# Patient Record
Sex: Male | Born: 1960
Health system: Southern US, Community
[De-identification: ages and names within clinical notes are randomized; demographics above are authoritative.]

## PROBLEM LIST (undated history)

## (undated) ENCOUNTER — Emergency Department (HOSPITAL_COMMUNITY): Discharge status: Absent without leave | Payer: 59

## (undated) DIAGNOSIS — E7800 Pure hypercholesterolemia, unspecified: Secondary | ICD-10-CM

## (undated) DIAGNOSIS — C801 Malignant (primary) neoplasm, unspecified: Secondary | ICD-10-CM

## (undated) DIAGNOSIS — E119 Type 2 diabetes mellitus without complications: Secondary | ICD-10-CM

## (undated) HISTORY — PX: PROSTATE BIOPSY: SHX241

## (undated) HISTORY — DX: Pure hypercholesterolemia, unspecified: E78.00

## (undated) HISTORY — DX: Type 2 diabetes mellitus without complications: E11.9

---

## 2016-02-15 ENCOUNTER — Ambulatory Visit (INDEPENDENT_AMBULATORY_CARE_PROVIDER_SITE_OTHER): Payer: PRIVATE HEALTH INSURANCE | Admitting: Neurology

## 2016-02-15 ENCOUNTER — Encounter: Payer: Self-pay | Admitting: Neurology

## 2016-02-15 VITALS — BP 124/70 | HR 51 | Ht 72.0 in | Wt 176.0 lb

## 2016-02-15 DIAGNOSIS — R202 Paresthesia of skin: Secondary | ICD-10-CM | POA: Diagnosis not present

## 2016-02-15 NOTE — Progress Notes (Signed)
Reason for visit: Paresthesias  Referring physician: Dr. Bufford Buttner is a 55 y.o. male  History of present illness:  Jesse Shah is a 55 year old left-handed white male with a history of type 1 diabetes. The patient over the last 5 or 6 weeks has noted some very mild numbness involving the tips of the fifth and fourth fingers on both hands. The patient denies any weakness of the upper extremities, he denies any pain in the neck or down the arms. He has not been dropping things from his hands. The patient has no numbness in the feet, he denies any balance issues, he denies issues controlling the bowels or the bladder. The numbness does not wake him up at night. He has not had any improvement in the numbness, but it has not worsened either. There is no way he can turn his head that will change the sensation in the hands. He was seen by his primary care physician and referred to this office for an evaluation.  Past Medical History:  Diagnosis Date  . Diabetes ([place])   . High cholesterol     Past Surgical History:  Procedure Laterality Date  . NO PAST SURGERIES      History reviewed. No pertinent family history.  Social history:  reports that he has never smoked. He has never used smokeless tobacco. He reports that he does not drink alcohol or use drugs.  Medications:  Prior to Admission medications   Medication Sig Start Date End Date Taking? Authorizing Provider  Insulin Glargine (TOUJEO SOLOSTAR) 300 UNIT/ML SOPN Inject 20 Units into the skin 3 (three) times daily.   Yes Historical Provider, MD  insulin lispro (HUMALOG) 100 UNIT/ML injection Inject 4 Units into the skin 3 (three) times daily with meals.   Yes Historical Provider, MD  simvastatin (ZOCOR) 20 MG tablet Take 20 mg by mouth at bedtime.   Yes Historical Provider, MD     Not on File  ROS:  Out of a complete 14 system review of symptoms, the patient complains only of the following symptoms, and all  other reviewed systems are negative.  Numbness  Blood pressure 124/70, pulse (!) 51, height 6' (1.829 m), weight 176 lb (79.8 kg).  Physical Exam  General: The patient is alert and cooperative at the time of the examination.  Eyes: Pupils are equal, round, and reactive to light. Discs are flat bilaterally.  Neck: The neck is supple, no carotid bruits are noted.  Respiratory: The respiratory examination is clear.  Cardiovascular: The cardiovascular examination reveals a regular rate and rhythm, no obvious murmurs or rubs are noted.  Skin: Extremities are without significant edema.  Neurologic Exam  Mental status: The patient is alert and oriented x 3 at the time of the examination. The patient has apparent normal recent and remote memory, with an apparently normal attention span and concentration ability.  Cranial nerves: Facial symmetry is present. There is good sensation of the face to pinprick and soft touch bilaterally. The strength of the facial muscles and the muscles to head turning and shoulder shrug are normal bilaterally. Speech is well enunciated, no aphasia or dysarthria is noted. Extraocular movements are full. Visual fields are full. The tongue is midline, and the patient has symmetric elevation of the soft palate. No obvious hearing deficits are noted.  Motor: The motor testing reveals 5 over 5 strength of all 4 extremities. Good symmetric motor tone is noted throughout.  Sensory: Sensory testing is intact to  pinprick, soft touch, vibration sensation, and position sense on all 4 extremities. No evidence of extinction is noted.  Coordination: Cerebellar testing reveals good finger-nose-finger and heel-to-shin bilaterally.  Gait and station: Gait is normal. Tandem gait is normal. Romberg is negative. No drift is seen.  Reflexes: Deep tendon reflexes are symmetric, but slightly depressed bilaterally. Toes are downgoing bilaterally.   Assessment/Plan:  1.  Paresthesias  The patient has some sensory alteration in the fourth and fifth fingers on both hands which may be in the ulnar nerve distribution. The patient has no evidence of weakness whatsoever on clinical examination. The sensory complaints are mild, we will opt to follow this issue conservatively for now. The patient will contact our office if he believes that the symptoms are worsening or changing in anyway or if any weakness is ensuing. We may consider EMG and nerve conduction study at that point. The patient will follow-up if needed.  Jill Alexanders MD 02/15/2016 3:32 PM  [facility] [address] [place],  [address]  Phone [phone] Fax [phone]

## 2016-02-15 NOTE — Patient Instructions (Signed)
   Call if any progression or change in symptoms.

## 2016-02-16 ENCOUNTER — Ambulatory Visit: Payer: PRIVATE HEALTH INSURANCE | Admitting: Neurology

## 2016-07-09 DIAGNOSIS — E1065 Type 1 diabetes mellitus with hyperglycemia: Secondary | ICD-10-CM | POA: Insufficient documentation

## 2016-07-09 DIAGNOSIS — E7800 Pure hypercholesterolemia, unspecified: Secondary | ICD-10-CM | POA: Insufficient documentation

## 2016-07-09 DIAGNOSIS — E559 Vitamin D deficiency, unspecified: Secondary | ICD-10-CM | POA: Insufficient documentation

## 2017-06-03 DIAGNOSIS — E039 Hypothyroidism, unspecified: Secondary | ICD-10-CM | POA: Diagnosis not present

## 2017-06-03 DIAGNOSIS — E559 Vitamin D deficiency, unspecified: Secondary | ICD-10-CM | POA: Diagnosis not present

## 2017-06-03 DIAGNOSIS — E7800 Pure hypercholesterolemia, unspecified: Secondary | ICD-10-CM | POA: Diagnosis not present

## 2017-06-03 DIAGNOSIS — Z794 Long term (current) use of insulin: Secondary | ICD-10-CM | POA: Diagnosis not present

## 2017-06-03 DIAGNOSIS — E1065 Type 1 diabetes mellitus with hyperglycemia: Secondary | ICD-10-CM | POA: Diagnosis not present

## 2017-09-10 DIAGNOSIS — E1065 Type 1 diabetes mellitus with hyperglycemia: Secondary | ICD-10-CM | POA: Diagnosis not present

## 2017-09-10 DIAGNOSIS — E7800 Pure hypercholesterolemia, unspecified: Secondary | ICD-10-CM | POA: Diagnosis not present

## 2017-09-13 DIAGNOSIS — E559 Vitamin D deficiency, unspecified: Secondary | ICD-10-CM | POA: Diagnosis not present

## 2017-09-13 DIAGNOSIS — E1065 Type 1 diabetes mellitus with hyperglycemia: Secondary | ICD-10-CM | POA: Diagnosis not present

## 2017-09-13 DIAGNOSIS — E7800 Pure hypercholesterolemia, unspecified: Secondary | ICD-10-CM | POA: Diagnosis not present

## 2017-10-02 DIAGNOSIS — Z0000 Encounter for general adult medical examination without abnormal findings: Secondary | ICD-10-CM | POA: Diagnosis not present

## 2017-10-02 DIAGNOSIS — Z13220 Encounter for screening for lipoid disorders: Secondary | ICD-10-CM | POA: Diagnosis not present

## 2017-10-07 DIAGNOSIS — Z23 Encounter for immunization: Secondary | ICD-10-CM | POA: Diagnosis not present

## 2017-10-07 DIAGNOSIS — R972 Elevated prostate specific antigen [PSA]: Secondary | ICD-10-CM | POA: Diagnosis not present

## 2017-10-07 DIAGNOSIS — Z6823 Body mass index (BMI) 23.0-23.9, adult: Secondary | ICD-10-CM | POA: Diagnosis not present

## 2017-10-07 DIAGNOSIS — Z1211 Encounter for screening for malignant neoplasm of colon: Secondary | ICD-10-CM | POA: Diagnosis not present

## 2017-10-07 DIAGNOSIS — Z0000 Encounter for general adult medical examination without abnormal findings: Secondary | ICD-10-CM | POA: Diagnosis not present

## 2017-11-08 DIAGNOSIS — R972 Elevated prostate specific antigen [PSA]: Secondary | ICD-10-CM | POA: Diagnosis not present

## 2017-12-31 DIAGNOSIS — E7800 Pure hypercholesterolemia, unspecified: Secondary | ICD-10-CM | POA: Diagnosis not present

## 2017-12-31 DIAGNOSIS — E1065 Type 1 diabetes mellitus with hyperglycemia: Secondary | ICD-10-CM | POA: Diagnosis not present

## 2018-01-03 DIAGNOSIS — E1065 Type 1 diabetes mellitus with hyperglycemia: Secondary | ICD-10-CM | POA: Diagnosis not present

## 2018-01-03 DIAGNOSIS — E7800 Pure hypercholesterolemia, unspecified: Secondary | ICD-10-CM | POA: Diagnosis not present

## 2018-01-03 DIAGNOSIS — E559 Vitamin D deficiency, unspecified: Secondary | ICD-10-CM | POA: Diagnosis not present

## 2018-01-15 DIAGNOSIS — R972 Elevated prostate specific antigen [PSA]: Secondary | ICD-10-CM | POA: Diagnosis not present

## 2018-02-10 DIAGNOSIS — C61 Malignant neoplasm of prostate: Secondary | ICD-10-CM | POA: Diagnosis not present

## 2018-02-18 DIAGNOSIS — C61 Malignant neoplasm of prostate: Secondary | ICD-10-CM | POA: Diagnosis not present

## 2018-02-21 ENCOUNTER — Other Ambulatory Visit: Payer: Self-pay | Admitting: Urology

## 2018-03-03 DIAGNOSIS — C61 Malignant neoplasm of prostate: Secondary | ICD-10-CM | POA: Diagnosis not present

## 2018-03-03 DIAGNOSIS — M6281 Muscle weakness (generalized): Secondary | ICD-10-CM | POA: Diagnosis not present

## 2018-03-12 DIAGNOSIS — M62838 Other muscle spasm: Secondary | ICD-10-CM | POA: Diagnosis not present

## 2018-03-12 DIAGNOSIS — N393 Stress incontinence (female) (male): Secondary | ICD-10-CM | POA: Diagnosis not present

## 2018-03-12 DIAGNOSIS — M6281 Muscle weakness (generalized): Secondary | ICD-10-CM | POA: Diagnosis not present

## 2018-03-12 DIAGNOSIS — C61 Malignant neoplasm of prostate: Secondary | ICD-10-CM | POA: Diagnosis not present

## 2018-03-27 DIAGNOSIS — E109 Type 1 diabetes mellitus without complications: Secondary | ICD-10-CM | POA: Diagnosis not present

## 2018-04-08 ENCOUNTER — Other Ambulatory Visit: Payer: Self-pay

## 2018-04-08 ENCOUNTER — Encounter (HOSPITAL_COMMUNITY): Payer: Self-pay

## 2018-04-08 ENCOUNTER — Encounter (HOSPITAL_COMMUNITY)
Admission: RE | Admit: 2018-04-08 | Discharge: 2018-04-08 | Disposition: A | Discharge status: Home / Self Care | Payer: 59 | Source: Ambulatory Visit | Attending: General Surgery | Admitting: General Surgery

## 2018-04-08 DIAGNOSIS — Z794 Long term (current) use of insulin: Secondary | ICD-10-CM | POA: Diagnosis not present

## 2018-04-08 DIAGNOSIS — E119 Type 2 diabetes mellitus without complications: Secondary | ICD-10-CM | POA: Diagnosis not present

## 2018-04-08 DIAGNOSIS — Z01818 Encounter for other preprocedural examination: Secondary | ICD-10-CM

## 2018-04-08 DIAGNOSIS — Z23 Encounter for immunization: Secondary | ICD-10-CM | POA: Diagnosis not present

## 2018-04-08 DIAGNOSIS — R001 Bradycardia, unspecified: Secondary | ICD-10-CM

## 2018-04-08 DIAGNOSIS — C61 Malignant neoplasm of prostate: Secondary | ICD-10-CM | POA: Insufficient documentation

## 2018-04-08 DIAGNOSIS — E7800 Pure hypercholesterolemia, unspecified: Secondary | ICD-10-CM | POA: Diagnosis not present

## 2018-04-08 HISTORY — DX: Malignant (primary) neoplasm, unspecified: C80.1

## 2018-04-08 LAB — HEMOGLOBIN A1C
Hgb A1c MFr Bld: 7.8 % — ABNORMAL HIGH (ref 4.8–5.6)
Mean Plasma Glucose: 177.16 mg/dL

## 2018-04-08 LAB — BASIC METABOLIC PANEL
Anion gap: 7 (ref 5–15)
BUN: 14 mg/dL (ref 6–20)
CO2: 29 mmol/L (ref 22–32)
Calcium: 9 mg/dL (ref 8.9–10.3)
Chloride: 105 mmol/L (ref 98–111)
Creatinine, Ser: 0.81 mg/dL (ref 0.61–1.24)
GFR calc Af Amer: >60 mL/min (ref >60)
Glucose, Bld: 69 mg/dL — ABNORMAL LOW (ref 70–99)
Potassium: 4.1 mmol/L (ref 3.5–5.1)
SODIUM: 141 mmol/L (ref 135–145)

## 2018-04-08 LAB — CBC
HCT: 43.5 % (ref 39.0–52.0)
Hemoglobin: 14.4 g/dL (ref 13.0–17.0)
MCH: 30.9 pg (ref 26.0–34.0)
MCHC: 33.1 g/dL (ref 30.0–36.0)
MCV: 93.3 fL (ref 80.0–100.0)
Platelets: 222 10*3/uL (ref 150–400)
RBC: 4.66 MIL/uL (ref 4.22–5.81)
RDW: 12 % (ref 11.5–15.5)
WBC: 4.4 10*3/uL (ref 4.0–10.5)
nRBC: 0.5 % — ABNORMAL HIGH (ref 0.0–0.2)

## 2018-04-08 LAB — GLUCOSE, CAPILLARY: Glucose-Capillary: 111 mg/dL — ABNORMAL HIGH (ref 70–99)

## 2018-04-08 MED ORDER — MAGNESIUM CITRATE PO SOLN
1.0000 | Freq: Once | ORAL | Status: DC
  Filled 2018-04-08: qty 296

## 2018-04-08 NOTE — Patient Instructions (Addendum)
Jesse Shah  04/08/2018   Your procedure is scheduled on: Wednesday  04/09/2018  Report to [facility] Main  Entrance              Report to admitting at   0630 AM    Call this number if you have problems the morning of surgery [phone]    How to Manage Your Diabetes Before and After Surgery  Why is it important to control my blood sugar before and after surgery? . Improving blood sugar levels before and after surgery helps healing and can limit problems. . A way of improving blood sugar control is eating a healthy diet by: o  Eating less sugar and carbohydrates o  Increasing activity/exercise o  Talking with your doctor about reaching your blood sugar goals . High blood sugars (greater than 180 mg/dL) can raise your risk of infections and slow your recovery, so you will need to focus on controlling your diabetes during the weeks before surgery. . Make sure that the doctor who takes care of your diabetes knows about your planned surgery including the date and location.  How do I manage my blood sugar before surgery? . Check your blood sugar at least 4 times a day, starting 2 days before surgery, to make sure that the level is not too high or low. o Check your blood sugar the morning of your surgery when you wake up and every 2 hours until you get to the Short Stay unit. . If your blood sugar is less than 70 mg/dL, you will need to treat for low blood sugar: o Do not take insulin. o Treat a low blood sugar (less than 70 mg/dL) with  cup of clear juice (cranberry or apple), 4 glucose tablets, OR glucose gel. o Recheck blood sugar in 15 minutes after treatment (to make sure it is greater than 70 mg/dL). If your blood sugar is not greater than 70 mg/dL on recheck, call [phone] for further instructions. . Report your blood sugar to the short stay nurse when you get to Short Stay.  . If you are admitted to the hospital after surgery: o Your blood  sugar will be checked by the staff and you will probably be given insulin after surgery (instead of oral diabetes medicines) to make sure you have good blood sugar levels. o The goal for blood sugar control after surgery is 80-180 mg/dL.   WHAT DO I DO ABOUT MY DIABETES MEDICATION?       Marland Kitchen Do not take oral diabetes medicines (pills) the morning of surgery.  . THE NIGHT BEFORE SURGERY, take  10   units of  Insulin Glargine (Toujeo Solostar)     insulin.       . THE MORNING OF SURGERY, take   0  units of    Humalog Kwikpen    Insulin  And follow instructions below for the sliding scale.   . If your CBG is greater than 220 mg/dL, you may take  of your sliding scale  . (correction) dose of insulin.           The day before surgery, Follow the Bowel Prep instructions from Dr. Tresa Moore and drink a clear liquid diet all day up until midnight!        The day before surgery, Drink ONE Bottle Magnesium Citrate by noon and drink clear liquids all day!    CLEAR  LIQUID DIET   Foods Allowed                                                                     Foods Excluded  Coffee and tea, regular and decaf                             liquids that you cannot  Plain Jell-O in any flavor                                             see through such as: Fruit ices (not with fruit pulp)                                     milk, soups, orange juice  Iced Popsicles                                    All solid food Carbonated beverages, regular and diet                                    Cranberry, grape and apple juices Sports drinks like Gatorade Lightly seasoned clear broth or consume(fat free) Sugar, honey syrup  Sample Menu Breakfast                                Lunch                                     Supper Cranberry juice                    Beef broth                            Chicken broth Jell-O                                     Grape juice                           Apple  juice Coffee or tea                        Jell-O                                      Popsicle  Coffee or tea                        Coffee or tea  _____________________________________________________________________    Remember: Do not eat food or drink liquids :After Midnight.               BRUSH YOUR TEETH MORNING OF SURGERY AND RINSE YOUR MOUTH OUT, NO CHEWING GUM CANDY OR MINTS.     Take these medicines the morning of surgery with A SIP OF WATER: none              DO NOT TAKE ANY DIABETIC MEDICATIONS DAY OF YOUR SURGERY!                               You may not have any metal on your body including hair pins and              piercings  Do not wear jewelry, make-up, lotions, powders or perfumes, deodorant                        Men may shave face and neck.   Do not bring valuables to the hospital. [place].  Contacts, dentures or bridgework may not be worn into surgery.  Leave suitcase in the car. After surgery it may be brought to your room.                  Please read over the following fact sheets you were given: _____________________________________________________________________             [facility] - Preparing for Surgery Before surgery, you can play an important role.  Because skin is not sterile, your skin needs to be as free of germs as possible.  You can reduce the number of germs on your skin by washing with CHG (chlorahexidine gluconate) soap before surgery.  CHG is an antiseptic cleaner which kills germs and bonds with the skin to continue killing germs even after washing. Please DO NOT use if you have an allergy to CHG or antibacterial soaps.  If your skin becomes reddened/irritated stop using the CHG and inform your nurse when you arrive at Short Stay. Do not shave (including legs and underarms) for at least 48 hours prior to the first CHG shower.  You may  shave your face/neck. Please follow these instructions carefully:  1.  Shower with CHG Soap the night before surgery and the  morning of Surgery.  2.  If you choose to wash your hair, wash your hair first as usual with your  normal  shampoo.  3.  After you shampoo, rinse your hair and body thoroughly to remove the  shampoo.                           4.  Use CHG as you would any other liquid soap.  You can apply chg directly  to the skin and wash                       Gently with a scrungie or clean washcloth.  5.  Apply the CHG Soap to your body ONLY FROM THE NECK DOWN.   Do not use on face/  open                           Wound or open sores. Avoid contact with eyes, ears mouth and genitals (private parts).                       Wash face,  Genitals (private parts) with your normal soap.             6.  Wash thoroughly, paying special attention to the area where your surgery  will be performed.  7.  Thoroughly rinse your body with warm water from the neck down.  8.  DO NOT shower/wash with your normal soap after using and rinsing off  the CHG Soap.                9.  Pat yourself dry with a clean towel.            10.  Wear clean pajamas.            11.  Place clean sheets on your bed the night of your first shower and do not  sleep with pets. Day of Surgery : Do not apply any lotions/deodorants the morning of surgery.  Please wear clean clothes to the hospital/surgery center.  FAILURE TO FOLLOW THESE INSTRUCTIONS MAY RESULT IN THE CANCELLATION OF YOUR SURGERY PATIENT SIGNATURE_________________________________  NURSE SIGNATURE__________________________________  ________________________________________________________________________

## 2018-04-08 NOTE — Progress Notes (Addendum)
Called patient to check up on patient from pre-op visit as blood glucose at the beginning of the pre-op appointment was 111. The blood glucose from serum at 0914 am was 69. Patient stated that he felt a little lightheaded and drank some gatorade and within 5 minutes felt much better. Instructed patient to call PCP if any problems with his glucose readings today and to check his glucose quick frequently. Patient verbalized understanding.

## 2018-04-09 ENCOUNTER — Encounter (HOSPITAL_COMMUNITY): Payer: Self-pay

## 2018-04-09 ENCOUNTER — Other Ambulatory Visit: Payer: Self-pay

## 2018-04-09 ENCOUNTER — Observation Stay (HOSPITAL_COMMUNITY)
Admission: RE | Admit: 2018-04-09 | Discharge: 2018-04-10 | Disposition: A | Discharge status: Home / Self Care | Payer: 59 | Attending: Urology | Admitting: Urology

## 2018-04-09 ENCOUNTER — Encounter (HOSPITAL_COMMUNITY)
Admission: RE | Disposition: A | Discharge status: Home / Self Care | Payer: Self-pay | Source: Home / Self Care | Attending: Urology

## 2018-04-09 ENCOUNTER — Ambulatory Visit (HOSPITAL_COMMUNITY): Payer: 59 | Admitting: Certified Registered Nurse Anesthetist

## 2018-04-09 ENCOUNTER — Ambulatory Visit (HOSPITAL_COMMUNITY): Payer: 59 | Admitting: Physician Assistant

## 2018-04-09 DIAGNOSIS — Z794 Long term (current) use of insulin: Secondary | ICD-10-CM | POA: Insufficient documentation

## 2018-04-09 DIAGNOSIS — E7800 Pure hypercholesterolemia, unspecified: Secondary | ICD-10-CM | POA: Insufficient documentation

## 2018-04-09 DIAGNOSIS — C61 Malignant neoplasm of prostate: Secondary | ICD-10-CM | POA: Diagnosis not present

## 2018-04-09 DIAGNOSIS — E119 Type 2 diabetes mellitus without complications: Secondary | ICD-10-CM | POA: Insufficient documentation

## 2018-04-09 HISTORY — PX: ROBOT ASSISTED LAPAROSCOPIC RADICAL PROSTATECTOMY: SHX5141

## 2018-04-09 HISTORY — PX: LYMPHADENECTOMY: SHX5960

## 2018-04-09 LAB — GLUCOSE, CAPILLARY
Glucose-Capillary: 195 mg/dL — ABNORMAL HIGH (ref 70–99)
Glucose-Capillary: 248 mg/dL — ABNORMAL HIGH (ref 70–99)
Glucose-Capillary: 235 mg/dL — ABNORMAL HIGH (ref 70–99)
Glucose-Capillary: 216 mg/dL — ABNORMAL HIGH (ref 70–99)
Glucose-Capillary: 174 mg/dL — ABNORMAL HIGH (ref 70–99)
Glucose-Capillary: 150 mg/dL — ABNORMAL HIGH (ref 70–99)
Glucose-Capillary: 126 mg/dL — ABNORMAL HIGH (ref 70–99)
Glucose-Capillary: 123 mg/dL — ABNORMAL HIGH (ref 70–99)
Glucose-Capillary: 127 mg/dL — ABNORMAL HIGH (ref 70–99)
Glucose-Capillary: 178 mg/dL — ABNORMAL HIGH (ref 70–99)
Glucose-Capillary: 195 mg/dL — ABNORMAL HIGH (ref 70–99)

## 2018-04-09 LAB — HEMOGLOBIN AND HEMATOCRIT, BLOOD
HCT: 37.3 % — ABNORMAL LOW (ref 39.0–52.0)
Hemoglobin: 12.6 g/dL — ABNORMAL LOW (ref 13.0–17.0)

## 2018-04-09 SURGERY — PROSTATECTOMY, RADICAL, ROBOT-ASSISTED, LAPAROSCOPIC
Anesthesia: General

## 2018-04-09 MED ORDER — HYDROMORPHONE HCL 1 MG/ML IJ SOLN
INTRAMUSCULAR | Status: DC | PRN
Start: 2018-04-09 — End: 2018-04-09
  Administered 2018-04-09: 1 mg via INTRAVENOUS

## 2018-04-09 MED ORDER — ONDANSETRON HCL 4 MG/2ML IJ SOLN
INTRAMUSCULAR | Status: AC
  Filled 2018-04-09: qty 2

## 2018-04-09 MED ORDER — EPHEDRINE 5 MG/ML INJ
INTRAVENOUS | Status: AC
  Filled 2018-04-09: qty 10

## 2018-04-09 MED ORDER — HYDROMORPHONE HCL 2 MG/ML IJ SOLN
INTRAMUSCULAR | Status: AC
  Filled 2018-04-09: qty 1

## 2018-04-09 MED ORDER — MEPERIDINE HCL 50 MG/ML IJ SOLN: 6.2500 mg | INTRAMUSCULAR | Status: DC | PRN

## 2018-04-09 MED ORDER — ONDANSETRON HCL 4 MG/2ML IJ SOLN
INTRAMUSCULAR | Status: DC | PRN
  Administered 2018-04-09: 4 mg via INTRAVENOUS

## 2018-04-09 MED ORDER — HYDROCODONE-ACETAMINOPHEN 5-325 MG PO TABS: 1.0000 | ORAL_TABLET | Freq: Four times a day (QID) | ORAL | 0 refills | Status: DC | PRN

## 2018-04-09 MED ORDER — OXYCODONE HCL 5 MG PO TABS: 5.0000 mg | ORAL_TABLET | Freq: Once | ORAL | Status: DC | PRN

## 2018-04-09 MED ORDER — ROCURONIUM BROMIDE 10 MG/ML (PF) SYRINGE
PREFILLED_SYRINGE | INTRAVENOUS | Status: AC
  Filled 2018-04-09: qty 10

## 2018-04-09 MED ORDER — SODIUM CHLORIDE (PF) 0.9 % IJ SOLN
INTRAMUSCULAR | Status: DC | PRN
  Administered 2018-04-09: 20 mL

## 2018-04-09 MED ORDER — PROPOFOL 10 MG/ML IV BOLUS
INTRAVENOUS | Status: AC
  Filled 2018-04-09: qty 20

## 2018-04-09 MED ORDER — OXYCODONE HCL 5 MG/5ML PO SOLN
5.0000 mg | Freq: Once | ORAL | Status: DC | PRN
  Filled 2018-04-09: qty 5

## 2018-04-09 MED ORDER — BUPIVACAINE LIPOSOME 1.3 % IJ SUSP
20.0000 mL | Freq: Once | INTRAMUSCULAR | Status: AC
  Administered 2018-04-09: 20 mL
  Filled 2018-04-09: qty 20

## 2018-04-09 MED ORDER — SODIUM CHLORIDE 0.9 % IV BOLUS
1000.0000 mL | Freq: Once | INTRAVENOUS | Status: AC
  Administered 2018-04-09: 1000 mL via INTRAVENOUS

## 2018-04-09 MED ORDER — HYDROMORPHONE HCL 1 MG/ML IJ SOLN: 0.2500 mg | INTRAMUSCULAR | Status: DC | PRN

## 2018-04-09 MED ORDER — SIMVASTATIN 20 MG PO TABS
20.0000 mg | ORAL_TABLET | Freq: Every evening | ORAL | Status: DC
  Administered 2018-04-09: 20 mg via ORAL
  Filled 2018-04-09: qty 1

## 2018-04-09 MED ORDER — SODIUM CHLORIDE (PF) 0.9 % IJ SOLN
INTRAMUSCULAR | Status: AC
  Filled 2018-04-09: qty 50

## 2018-04-09 MED ORDER — OXYCODONE HCL 5 MG PO TABS
5.0000 mg | ORAL_TABLET | ORAL | Status: DC | PRN
  Administered 2018-04-10 (×2): 5 mg via ORAL
  Filled 2018-04-09 (×2): qty 1

## 2018-04-09 MED ORDER — MIDAZOLAM HCL 2 MG/2ML IJ SOLN
INTRAMUSCULAR | Status: AC
  Filled 2018-04-09: qty 2

## 2018-04-09 MED ORDER — ROCURONIUM BROMIDE 50 MG/5ML IV SOSY
PREFILLED_SYRINGE | INTRAVENOUS | Status: DC | PRN
  Administered 2018-04-09: 10 mg via INTRAVENOUS
  Administered 2018-04-09: 20 mg via INTRAVENOUS
  Administered 2018-04-09: 50 mg via INTRAVENOUS
  Administered 2018-04-09 (×3): 20 mg via INTRAVENOUS

## 2018-04-09 MED ORDER — INSULIN REGULAR(HUMAN) IN NACL 100-0.9 UT/100ML-% IV SOLN
INTRAVENOUS | Status: DC
  Filled 2018-04-09: qty 100

## 2018-04-09 MED ORDER — INSULIN ASPART 100 UNIT/ML ~~LOC~~ SOLN
0.0000 [IU] | SUBCUTANEOUS | Status: DC
  Administered 2018-04-09 (×2): 2 [IU] via SUBCUTANEOUS
  Administered 2018-04-09: 1 [IU] via SUBCUTANEOUS
  Administered 2018-04-10: 2 [IU] via SUBCUTANEOUS
  Administered 2018-04-10: 5 [IU] via SUBCUTANEOUS
  Administered 2018-04-10: 1 [IU] via SUBCUTANEOUS

## 2018-04-09 MED ORDER — FENTANYL CITRATE (PF) 100 MCG/2ML IJ SOLN
INTRAMUSCULAR | Status: DC | PRN
  Administered 2018-04-09: 50 ug via INTRAVENOUS
  Administered 2018-04-09 (×2): 100 ug via INTRAVENOUS

## 2018-04-09 MED ORDER — FENTANYL CITRATE (PF) 250 MCG/5ML IJ SOLN
INTRAMUSCULAR | Status: AC
  Filled 2018-04-09: qty 5

## 2018-04-09 MED ORDER — EPHEDRINE SULFATE-NACL 50-0.9 MG/10ML-% IV SOSY
PREFILLED_SYRINGE | INTRAVENOUS | Status: DC | PRN
  Administered 2018-04-09 (×2): 10 mg via INTRAVENOUS

## 2018-04-09 MED ORDER — SULFAMETHOXAZOLE-TRIMETHOPRIM 800-160 MG PO TABS: 1.0000 | ORAL_TABLET | Freq: Two times a day (BID) | ORAL | 0 refills | Status: DC

## 2018-04-09 MED ORDER — DIPHENHYDRAMINE HCL 50 MG/ML IJ SOLN: 12.5000 mg | Freq: Four times a day (QID) | INTRAMUSCULAR | Status: DC | PRN

## 2018-04-09 MED ORDER — ACETAMINOPHEN 500 MG PO TABS
1000.0000 mg | ORAL_TABLET | Freq: Four times a day (QID) | ORAL | Status: DC
  Administered 2018-04-09 – 2018-04-10 (×3): 1000 mg via ORAL
  Filled 2018-04-09 (×3): qty 2

## 2018-04-09 MED ORDER — MIDAZOLAM HCL 5 MG/5ML IJ SOLN
INTRAMUSCULAR | Status: DC | PRN
  Administered 2018-04-09: 2 mg via INTRAVENOUS

## 2018-04-09 MED ORDER — SUGAMMADEX SODIUM 200 MG/2ML IV SOLN
INTRAVENOUS | Status: DC | PRN
  Administered 2018-04-09: 200 mg via INTRAVENOUS

## 2018-04-09 MED ORDER — DIPHENHYDRAMINE HCL 12.5 MG/5ML PO ELIX: 12.5000 mg | ORAL_SOLUTION | Freq: Four times a day (QID) | ORAL | Status: DC | PRN

## 2018-04-09 MED ORDER — LACTATED RINGERS IV SOLN
INTRAVENOUS | Status: AC
  Administered 2018-04-09: 1000 mL via INTRAVENOUS
  Administered 2018-04-09: 11:00:00 via INTRAVENOUS

## 2018-04-09 MED ORDER — LIDOCAINE 2% (20 MG/ML) 5 ML SYRINGE
INTRAMUSCULAR | Status: AC
  Filled 2018-04-09: qty 5

## 2018-04-09 MED ORDER — INSULIN ASPART 100 UNIT/ML ~~LOC~~ SOLN: 0.0000 [IU] | Freq: Three times a day (TID) | SUBCUTANEOUS | Status: DC

## 2018-04-09 MED ORDER — INDOCYANINE GREEN 25 MG IV SOLR
INTRAVENOUS | Status: DC | PRN
  Administered 2018-04-09: 25 mg

## 2018-04-09 MED ORDER — HYDROMORPHONE HCL 1 MG/ML IJ SOLN
0.5000 mg | INTRAMUSCULAR | Status: DC | PRN
  Administered 2018-04-09: 1 mg via INTRAVENOUS
  Filled 2018-04-09: qty 1

## 2018-04-09 MED ORDER — INSULIN GLARGINE 100 UNIT/ML ~~LOC~~ SOLN
15.0000 [IU] | Freq: Every day | SUBCUTANEOUS | Status: DC
  Administered 2018-04-09: 15 [IU] via SUBCUTANEOUS
  Filled 2018-04-09 (×2): qty 0.15

## 2018-04-09 MED ORDER — SODIUM CHLORIDE 0.9 % IV SOLN
INTRAVENOUS | Status: DC | PRN
  Administered 2018-04-09: 1.9 [IU]/h via INTRAVENOUS

## 2018-04-09 MED ORDER — INSULIN GLARGINE 100 UNIT/ML ~~LOC~~ SOLN
15.0000 [IU] | Freq: Every day | SUBCUTANEOUS | Status: DC
  Filled 2018-04-09: qty 0.15

## 2018-04-09 MED ORDER — BELLADONNA ALKALOIDS-OPIUM 16.2-60 MG RE SUPP
1.0000 | Freq: Four times a day (QID) | RECTAL | Status: DC | PRN
  Administered 2018-04-09: 1 via RECTAL
  Filled 2018-04-09: qty 1

## 2018-04-09 MED ORDER — PROMETHAZINE HCL 25 MG/ML IJ SOLN
INTRAMUSCULAR | Status: AC
  Filled 2018-04-09: qty 1

## 2018-04-09 MED ORDER — CEFAZOLIN SODIUM-DEXTROSE 2-4 GM/100ML-% IV SOLN
2.0000 g | INTRAVENOUS | Status: AC
  Administered 2018-04-09: 2 g via INTRAVENOUS
  Filled 2018-04-09: qty 100

## 2018-04-09 MED ORDER — PROPOFOL 10 MG/ML IV BOLUS
INTRAVENOUS | Status: DC | PRN
  Administered 2018-04-09: 170 mg via INTRAVENOUS

## 2018-04-09 MED ORDER — ONDANSETRON HCL 4 MG/2ML IJ SOLN
4.0000 mg | INTRAMUSCULAR | Status: DC | PRN
  Administered 2018-04-09: 4 mg via INTRAVENOUS
  Filled 2018-04-09: qty 2

## 2018-04-09 MED ORDER — PROMETHAZINE HCL 25 MG/ML IJ SOLN
6.2500 mg | INTRAMUSCULAR | Status: DC | PRN
  Administered 2018-04-09: 6.25 mg via INTRAVENOUS

## 2018-04-09 MED ORDER — SODIUM CHLORIDE 0.45 % IV SOLN
INTRAVENOUS | Status: DC
  Administered 2018-04-09 (×2): via INTRAVENOUS

## 2018-04-09 MED ORDER — LIDOCAINE 2% (20 MG/ML) 5 ML SYRINGE
INTRAMUSCULAR | Status: DC | PRN
  Administered 2018-04-09: 80 mg via INTRAVENOUS

## 2018-04-09 MED ORDER — SUGAMMADEX SODIUM 200 MG/2ML IV SOLN
INTRAVENOUS | Status: AC
  Filled 2018-04-09: qty 2

## 2018-04-09 SURGICAL SUPPLY — 65 items
APPLICATOR COTTON TIP 6 STRL (MISCELLANEOUS) ×2 IMPLANT
APPLICATOR COTTON TIP 6IN STRL (MISCELLANEOUS) ×3
CATH FOLEY 2WAY SLVR 18FR 30CC (CATHETERS) ×3 IMPLANT
CATH TIEMANN FOLEY 18FR 5CC (CATHETERS) ×3 IMPLANT
CHLORAPREP W/TINT 26ML (MISCELLANEOUS) ×3 IMPLANT
CLIP VESOLOCK LG 6/CT PURPLE (CLIP) ×8 IMPLANT
CLOTH BEACON ORANGE TIMEOUT ST (SAFETY) ×2 IMPLANT
CONT SPEC 4OZ CLIKSEAL STRL BL (MISCELLANEOUS) ×3 IMPLANT
COVER TIP SHEARS 8 DVNC (MISCELLANEOUS) ×2 IMPLANT
COVER TIP SHEARS 8MM DA VINCI (MISCELLANEOUS) ×1
COVER WAND RF STERILE (DRAPES) IMPLANT
CUTTER ECHEON FLEX ENDO 45 340 (ENDOMECHANICALS) ×3 IMPLANT
DECANTER SPIKE VIAL GLASS SM (MISCELLANEOUS) ×3 IMPLANT
DERMABOND ADVANCED (GAUZE/BANDAGES/DRESSINGS) ×1
DERMABOND ADVANCED .7 DNX12 (GAUZE/BANDAGES/DRESSINGS) ×2 IMPLANT
DRAPE ARM DVNC X/XI (DISPOSABLE) ×8 IMPLANT
DRAPE COLUMN DVNC XI (DISPOSABLE) ×2 IMPLANT
DRAPE DA VINCI XI ARM (DISPOSABLE) ×4
DRAPE DA VINCI XI COLUMN (DISPOSABLE) ×1
DRAPE SURG IRRIG POUCH 19X23 (DRAPES) ×3 IMPLANT
DRSG TEGADERM 4X4.75 (GAUZE/BANDAGES/DRESSINGS) ×3 IMPLANT
ELECT PENCIL ROCKER SW 15FT (MISCELLANEOUS) ×3 IMPLANT
ELECT REM PT RETURN 15FT ADLT (MISCELLANEOUS) ×3 IMPLANT
GAUZE SPONGE 2X2 8PLY STRL LF (GAUZE/BANDAGES/DRESSINGS) IMPLANT
GLOVE BIO SURGEON STRL SZ 6.5 (GLOVE) ×3 IMPLANT
GLOVE BIOGEL M STRL SZ7.5 (GLOVE) ×7 IMPLANT
GLOVE BIOGEL PI IND STRL 7.5 (GLOVE) ×2 IMPLANT
GLOVE BIOGEL PI INDICATOR 7.5 (GLOVE) ×1
GOWN STRL REUS W/TWL LRG LVL3 (GOWN DISPOSABLE) ×10 IMPLANT
HOLDER FOLEY CATH W/STRAP (MISCELLANEOUS) ×3 IMPLANT
IRRIG SUCT STRYKERFLOW 2 WTIP (MISCELLANEOUS) ×3
IRRIGATION SUCT STRKRFLW 2 WTP (MISCELLANEOUS) ×2 IMPLANT
IV LACTATED RINGERS 1000ML (IV SOLUTION) ×3 IMPLANT
KIT PROCEDURE DA VINCI SI (MISCELLANEOUS) ×1
KIT PROCEDURE DVNC SI (MISCELLANEOUS) ×2 IMPLANT
NDL INSUFFLATION 14GA 120MM (NEEDLE) ×2 IMPLANT
NDL SPNL 22GX7 QUINCKE BK (NEEDLE) ×2 IMPLANT
NEEDLE INSUFFLATION 14GA 120MM (NEEDLE) ×3 IMPLANT
NEEDLE SPNL 22GX7 QUINCKE BK (NEEDLE) ×3 IMPLANT
PACK ROBOT UROLOGY CUSTOM (CUSTOM PROCEDURE TRAY) ×3 IMPLANT
PAD POSITIONING PINK XL (MISCELLANEOUS) ×3 IMPLANT
PORT ACCESS TROCAR AIRSEAL 12 (TROCAR) ×2 IMPLANT
PORT ACCESS TROCAR AIRSEAL 5M (TROCAR) ×1
RELOAD STAPLE 45 4.1 GRN THCK (STAPLE) ×2 IMPLANT
SEAL CANN UNIV 5-8 DVNC XI (MISCELLANEOUS) ×8 IMPLANT
SEAL XI 5MM-8MM UNIVERSAL (MISCELLANEOUS) ×4
SET TRI-LUMEN FLTR TB AIRSEAL (TUBING) ×3 IMPLANT
SOLUTION ELECTROLUBE (MISCELLANEOUS) ×3 IMPLANT
SPONGE GAUZE 2X2 STER 10/PKG (GAUZE/BANDAGES/DRESSINGS)
SPONGE LAP 4X18 RFD (DISPOSABLE) ×3 IMPLANT
STAPLE RELOAD 45 GRN (STAPLE) ×2 IMPLANT
STAPLE RELOAD 45MM GREEN (STAPLE) ×1
SUT ETHILON 3 0 PS 1 (SUTURE) ×3 IMPLANT
SUT MNCRL AB 4-0 PS2 18 (SUTURE) ×6 IMPLANT
SUT PDS AB 1 CT1 27 (SUTURE) ×6 IMPLANT
SUT VIC AB 2-0 CT1 27 (SUTURE) ×2
SUT VIC AB 2-0 CT1 27XBRD (SUTURE) IMPLANT
SUT VIC AB 2-0 SH 27 (SUTURE) ×1
SUT VIC AB 2-0 SH 27X BRD (SUTURE) ×2 IMPLANT
SUT VICRYL 0 UR6 27IN ABS (SUTURE) ×3 IMPLANT
SUT VLOC BARB 180 ABS3/0GR12 (SUTURE) ×9
SUTURE VLOC BRB 180 ABS3/0GR12 (SUTURE) ×6 IMPLANT
SYR 27GX1/2 1ML LL SAFETY (SYRINGE) ×3 IMPLANT
TOWEL OR NON WOVEN STRL DISP B (DISPOSABLE) ×3 IMPLANT
WATER STERILE IRR 1000ML POUR (IV SOLUTION) ×3 IMPLANT

## 2018-04-09 NOTE — Brief Op Note (Signed)
04/09/2018  11:24 AM  PATIENT:  Raelyn Mora  58 y.o. male  PRE-OPERATIVE DIAGNOSIS:  PROSTATE CANCER  POST-OPERATIVE DIAGNOSIS:  PROSTATE CANCER  PROCEDURE:  Procedure(s) with comments: XI ROBOTIC ASSISTED LAPAROSCOPIC RADICAL PROSTATECTOMY (N/A) - 3 HRS LYMPHADENECTOMY (Bilateral)  SURGEON:  Surgeon(s) and Role:    * Alexis Frock, MD - Primary  PHYSICIAN ASSISTANT:   ASSISTANTS: Debbrah Alar PA   ANESTHESIA:   local and general  EBL:  150 mL   BLOOD ADMINISTERED:none  DRAINS: 1 - JP to bulb; 2 - Foley to gravity   LOCAL MEDICATIONS USED:  MARCAINE     SPECIMEN:  Source of Specimen:  1 - prostatectomy; 2- peri-prostatic fat; 3 - pelvic lymph nodes  DISPOSITION OF SPECIMEN:  PATHOLOGY  COUNTS:  YES  TOURNIQUET:  * No tourniquets in log *  DICTATION: .Other Dictation: Dictation Number  [id number]  PLAN OF CARE: Admit for overnight observation  PATIENT DISPOSITION:  PACU - hemodynamically stable.   Delay start of Pharmacological VTE agent (>24hrs) due to surgical blood loss or risk of bleeding: yes

## 2018-04-09 NOTE — Discharge Instructions (Signed)

## 2018-04-09 NOTE — Progress Notes (Signed)
PACU NURSING NOTE: Spoke with Diabetes Nurse Coordinator to inform of patient current status, insulin gtt status and discuss plan of care. Stated will review chart and was already aware of patients admission with Insulin gtt.

## 2018-04-09 NOTE — Op Note (Signed)
NAMEKEIR, VIERNES MEDICAL RECORD OX:[id number] ACCOUNT [id number] DATE OF BIRTH:February 17, 1961 FACILITY: [facility] LOCATION: [facility] PHYSICIAN:Raine Elsass, MD  OPERATIVE REPORT  DATE OF PROCEDURE:  04/09/2018  PREOPERATIVE DIAGNOSIS:  Moderate risk prostate cancer, enlarged prostate with median lobe.  POSTOPERATIVE DIAGNOSIS:  Moderate risk prostate cancer, enlarged prostate with median lobe.  PROCEDURE: 1.  Robotic-assisted laparoscopic radical prostatectomy. 2.  Bilateral pelvic lymphadenectomy. 3.  Injection of an indocyanine green dye for sentinel lymph angiography.  ESTIMATED BLOOD LOSS:  426 mL  COMPLICATIONS:  None.  SPECIMENS: 1.  Right external iliac lymph nodes. 2.  Right obturator lymph nodes. 3.  Left external iliac lymph nodes. 4.  Left obturator lymph nodes. 5.  Periprostatic fat. 6.  Prostatectomy.  ASSISTANT:  Bari Mantis, PA  DRAINS:   1.  Jackson-Pratt drain bulb suction. 2.  Foley catheter to straight drain.  FINDINGS: 1.  No evidence of sentinel lymph nodes within the pelvis. 2.  Large trilobar prostatic hypertrophy with a dominant median lobe.  This did require bladder neck reconstruction.  INDICATIONS:  The patient is a very pleasant and very vigorous 58 year old gentleman who was found on workup of elevated PSA to have adenocarcinoma of the prostate, moderate risk.  Options were discussed for management including surveillance protocols  versus ablative therapy for surgical extirpation and he wished to proceed with prostatectomy with curative intent.  Informed consent was obtained and placed in medical record.  DESCRIPTION OF PROCEDURE:  The patient was identified.  Procedure being prostatectomy was confirmed.  Procedure timeout was performed.  Antibiotics administered.  General endotracheal anesthesia induced.  The patient was placed into a low lithotomy  position, sterile field was created prepped and draped base of the penis, perineum and  proximal thighs using iodine and his infra-xiphoid abdomen using chlorhexidine gluconate.  He was further fastened to operative table using 3-inch tape over foam  padding across the supraxiphoid chest.  A test of steep Trendelenburg positioning was performed and found to be suitably positioned.  A high-flow, low-pressure pneumoperitoneum was obtained using Veress technique in the infraumbilical midline having  passed the aspiration and drop test.  An 8 mm robotic camera port was then placed in the same location.  Laparoscopic examination preoperative showed no significant adhesions, no visceral injury.  Distal ports were placed as follows:  Right paramedian 8  mm robotic port, right far lateral 12 mm AirSeal assist port, right paramedian 5 mm suction port, left paramedian 8 mm robotic port, left far lateral 8 mm robotic port.  Robot was docked and passed electronic checks.  Initial attention was directed at  developing the space of Retzius.  Incision was made lateral to the left medial umbilical ligament from the midline towards the internal ring coursing along the iliac vessels towards the area of the left ureter.  Left vas deferens was encountered and  ligated using medial bucket handle as the left bladder wall was swept away from the pelvic sidewall towards the area of the endopelvic fascia on the left side.  A mirror image dissection was performed on the right side.  This exposed the anterior base of  the prostate which was then defatted to better demarcate the bladder neck, prostate junction.  This was set aside and labeled as periprosthetic fat.  Next, 0.2 mL of an indocyanine green dye was injected into each lobe of the prostate using a  percutaneously placed robotically guided spinal needle with intervening dye suction to prevent dye spillage, which did not occur.  Next,  the endo fascia was carefully swept away from the lateral aspect of the prostate and base to apex orientation.  This  exposed the  dorsal venous complex which was carefully controlled using endoscopic stapler, taking exquisite care to avoid membranous urethral injury which did not occur.  It had been approximately 10 minutes post-dye injection.  The pelvis was inspected  under near infrared fluorescence light.  Sentinel lymph angiography revealed good uptake of the prostate parenchyma with an indocyanine green dye; however, there were no obvious sentinel lymphatic channels seen coursing towards the pelvic lymph node field.  As such, standard template  lymphadenectomy was performed on the right side first with the boundaries of the right external iliac artery, vein, pelvic sidewall, iliac bifurcation.  Lymphostasis was achieved with cold clips, set aside labeled right external iliac lymph nodes.  Next,  the right obturator group was dissected free with the boundaries being right external iliac vein, pelvic sidewall, obturator nerve.  Lymphostasis achieved with cold clips.  The obturator nerve was inspected following maneuvers and found to be uninjured.   A mirror image lymphadenectomy was performed on the left side of the external iliac and left obturator group respectively.  The left obturator nerve was inspected following maneuvers and found to be uninjured.  Attention was directed at bladder neck  dissection.  Bladder neck was identified by moving the Foley catheter back and forth and a lateral release was performed on each side to better demarcate the bladder neck, prostate junction.  The bladder neck was carefully separated from the base of the  prostate in anterior, posterior direction, keeping what appeared to be a rim of circular muscle fibers at each plane of dissection.  As anticipated, there was a very large median lobe.  This was managed by placing figure-of-eight 2-0 Vicryl through and  through the median lobe and using this to provide superior retraction of this as the median lobe was delivered from the bladder neck and  carefully incised at the bladder mucosa level, taking exquisite care to button holing of the bladder or injury to the  ureteral orifices, neither of which occurred.  Posterior dissection was then performed by entering the plane of Denonvilliers.  Bilateral vas deferens were dissected for a distance of approximately 2 cm, ligated and placed on gentle superior traction.   Bilateral seminal vesicles were dissected to their tips and placed on gentle superior traction.  Dissection proceeded inferiorly towards the apex of the prostate.  This exposed the vascular pedicles on each side.  These were controlled using a sequential  clipping technique in a base apex orientation.  Aggressive nerve sparing was performed on the left side.  A minimal nerve sparing performed on the right given the right lateral positivity of cancer.  Final apical dissection was performed in the anterior  plane placing the prostate on superior traction and transecting the membranous urethra coldly.  This completely freed up the prostatectomy specimen and it was placed in EndoCatch bag for later retrieval.  Given the large median lobe, bladder neck  reconstruction was required and a single figure-of-eight Vicryl suture was placed at the 3 o'clock and at the 9 o'clock position of the bladder neck respectively, which better reapproximated the caliber of the bladder neck to the membranous urethral  stump.  Posterior dissection was performed with 3-0 V-Loc suture reapproximating the posterior urethral plate to the posterior bladder neck, bringing the structures into tension-free apposition.  Mucosa-to-mucosa anastomosis was performed using a double  arm 3-0  V-Loc suture reapproximating the bladder neck to the membranous urethral stump which occurred very well without tension.  A new Foley catheter was then placed which irrigated quantitatively.  All sponge, needle counts were correct.  Hemostasis  appeared excellent.  A closed suction drain was  brought to the previous left lateral at most robotic port site near the peritoneal cavity.  Robot was then undocked.  The previous right lateral most 12 mm assistant port site was closed at the level of the  fascia using a Carter-Thomason suture passer and 0 Vicryl.  Specimen was retrieved by extending the previous camera port site inferiorly for a distance of approximately 4 cm, removing the prostatectomy specimen, setting aside for permanent pathology.   The extraction site was closed at the level of the fascia using figure-of-eight PDS x3 and reapproximation of Scarpa's with a running Vicryl.  All incision sites were infiltrated with dilute lipolyzed Marcaine and closed at the level skin using  subcuticular Monocryl with Dermabond.  Procedure terminated.  The patient tolerated the procedure well.  No immediate perioperative complications.  The patient taken to postanesthesia care in stable condition.  Please note, first assistant Bari Mantis was crucial for all portions of the surgery today.  She provided invaluable retraction, vascular clipping, vascular stapling, specimen manipulation.  TN/NUANCE  D:04/09/2018 T:04/09/2018 JOB:[id number]

## 2018-04-09 NOTE — Progress Notes (Signed)
Inpatient Diabetes Program Recommendations  AACE/ADA: New Consensus Statement on Inpatient Glycemic Control (2015)  Target Ranges:  Prepandial:   less than 140 mg/dL      Peak postprandial:   less than 180 mg/dL (1-2 hours)      Critically ill patients:  140 - 180 mg/dL   Lab Results  Component Value Date   GLUCAP 123 (H) 04/09/2018   HGBA1C 7.8 (H) 04/08/2018    Review of Glycemic Control  Diabetes history: DM1 Outpatient Diabetes medications: Toujeo 20 units QHS, Humalog 5-10 units tidwc per s/s Current orders for Inpatient glycemic control: On IV insulin  HgbA1C - 7.8% - above goal.  Inpatient Diabetes Program Recommendations:     Transition off insulin drip. Lantus 15 units QHS Novolog 0-9 units Q4H Add Novolog 3 units tidwc when po intake > 50%.  Spoke with Hinton Dyer, RN, regarding above.  Will follow.  Thank you. Lorenda Peck, RD, LDN, CDE Inpatient Diabetes Coordinator [phone]

## 2018-04-09 NOTE — Transfer of Care (Signed)
Immediate Anesthesia Transfer of Care Note  Patient: Jesse Shah  Procedure(s) Performed: XI ROBOTIC ASSISTED LAPAROSCOPIC RADICAL PROSTATECTOMY (N/A ) LYMPHADENECTOMY (Bilateral )  Patient Location: PACU  Anesthesia Type:General  Level of Consciousness: alert   Airway & Oxygen Therapy: Patient Spontanous Breathing and Patient connected to face mask oxygen  Post-op Assessment: Report given to RN, Post -op Vital signs reviewed and stable and Patient moving all extremities X 4  Post vital signs: Reviewed and stable  Last Vitals:  Vitals Value Taken Time  BP 140/77 04/09/2018 11:39 AM  Temp    Pulse 74 04/09/2018 11:41 AM  Resp 8 04/09/2018 11:41 AM  SpO2 100 % 04/09/2018 11:41 AM  Vitals shown include unvalidated device data.  Last Pain:  Vitals:   04/09/18 0655  TempSrc:   PainSc: (P) 2       Patients Stated Pain Goal: (P) 4 (46/65/99 3570)  Complications: No apparent anesthesia complications

## 2018-04-09 NOTE — Anesthesia Postprocedure Evaluation (Signed)
Anesthesia Post Note  Patient: Jesse Shah  Procedure(s) Performed: XI ROBOTIC ASSISTED LAPAROSCOPIC RADICAL PROSTATECTOMY (N/A ) LYMPHADENECTOMY (Bilateral )     Patient location during evaluation: PACU Anesthesia Type: General Level of consciousness: awake and alert Pain management: pain level controlled Vital Signs Assessment: post-procedure vital signs reviewed and stable Respiratory status: spontaneous breathing, nonlabored ventilation and respiratory function stable Cardiovascular status: blood pressure returned to baseline and stable Postop Assessment: no apparent nausea or vomiting Anesthetic complications: no    Last Vitals:  Vitals:   04/09/18 1430 04/09/18 1445  BP: (!) 103/44 109/63  Pulse: (!) 58 63  Resp: 13 12  Temp: 36.7 C   SpO2: 98% 100%    Last Pain:  Vitals:   04/09/18 1430  TempSrc:   PainSc: 0-No pain                 Lidia Collum

## 2018-04-09 NOTE — H&P (Signed)
Jesse Shah is an 58 y.o. male.    Chief Complaint: Pre-Op Prostatectomy  HPI:   1- Moderate Risk Prostate Cancer - 2 core 10% RLB, RMB Grade 1 and 2 cancer on eval PSA 3.93. TRUS 59ml WITH LARGE MEDIAN lobe 12/2017 at age 76.   PMH sig or IDDM1 (very compliant, no neuropathy). NO prior surgeries. NO ischemic CV disease / blood thinners. 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 His wife is a Careers information officer with [employer]. His PCP is Jesse Cipro MD.   Today " Jesse Shah " is seen to proceed with prostatectomy.    Past Medical History:  Diagnosis Date  . Cancer [facility])    prostate cancer  . Diabetes ([place])   . High cholesterol     Past Surgical History:  Procedure Laterality Date  . PROSTATE BIOPSY      Family History  Problem Relation Age of Onset  . Healthy Mother   . Healthy Father   . Healthy Sister   . Healthy Brother    Social History:  reports that he has never smoked. He has never used smokeless tobacco. He reports that he does not drink alcohol or use drugs.  Allergies: No Known Allergies  Medications Prior to Admission  Medication Sig Dispense Refill  . Insulin Glargine (TOUJEO SOLOSTAR) 300 UNIT/ML SOPN Inject 20 Units into the skin every evening.     . insulin lispro (HUMALOG) 100 UNIT/ML injection Inject 5-10 Units into the skin 3 (three) times daily before meals. Per sliding scale    . simvastatin (ZOCOR) 20 MG tablet Take 20 mg by mouth every evening.       Results for orders placed or performed during the hospital encounter of 04/09/18 (from the past 48 hour(s))  Glucose, capillary     Status: Abnormal   Collection Time: 04/09/18  7:05 AM  Result Value Ref Range   Glucose-Capillary 195 (H) 70 - 99 mg/dL   No results found.  Review of Systems  Constitutional: Negative.   Eyes: Negative.   Respiratory: Negative.   Cardiovascular: Negative.   Gastrointestinal: Negative.   Genitourinary: Negative.   Skin: Negative.   Neurological: Negative.    Endo/Heme/Allergies: Negative.   Psychiatric/Behavioral: Negative.     Blood pressure 129/73, pulse 69, temperature 98.1 F (36.7 C), temperature source Oral, resp. rate 16, height 6' (1.829 m), weight 79.4 kg, SpO2 100 %. Physical Exam  Constitutional: He appears well-developed.  HENT:  Head: Normocephalic.  Eyes: Pupils are equal, round, and reactive to light.  Neck: Normal range of motion.  Cardiovascular: Normal rate.  Respiratory: Effort normal.  GI: Soft.  Genitourinary:    Genitourinary Comments: No CVAT   Musculoskeletal: Normal range of motion.  Neurological: He is alert.  Skin: Skin is warm.  Psychiatric: He has a normal mood and affect.     Assessment/Plan  1- Moderate Risk Prostate Cancer - proceed as planned with prostatectomy with node dissection. Risks, benefits, alternatives, peri-op course discussed extensively previously and reiterated today.   Jesse Frock, MD 04/09/2018, 8:15 AM

## 2018-04-09 NOTE — Anesthesia Preprocedure Evaluation (Signed)
Anesthesia Evaluation  Patient identified by MRN, date of birth, ID band Patient awake    Reviewed: Allergy & Precautions, NPO status , Patient's Chart, lab work & pertinent test results  Airway Mallampati: II  TM Distance: >3 FB Neck ROM: Full    Dental no notable dental hx.    Pulmonary neg pulmonary ROS,    Pulmonary exam normal breath sounds clear to auscultation       Cardiovascular negative cardio ROS Normal cardiovascular exam Rhythm:Regular Rate:Normal     Neuro/Psych negative neurological ROS  negative psych ROS   GI/Hepatic negative GI ROS, Neg liver ROS,   Endo/Other  negative endocrine ROSdiabetes  Renal/GU negative Renal ROS  negative genitourinary   Musculoskeletal negative musculoskeletal ROS (+)   Abdominal   Peds negative pediatric ROS (+)  Hematology negative hematology ROS (+)   Anesthesia Other Findings   Reproductive/Obstetrics negative OB ROS                             Anesthesia Physical Anesthesia Plan  ASA: III  Anesthesia Plan: General   Post-op Pain Management:    Induction: Intravenous  PONV Risk Score and Plan: 2 and Ondansetron and Midazolam  Airway Management Planned: Oral ETT  Additional Equipment:   Intra-op Plan:   Post-operative Plan: Extubation in OR  Informed Consent: I have reviewed the patients History and Physical, chart, labs and discussed the procedure including the risks, benefits and alternatives for the proposed anesthesia with the patient or authorized representative who has indicated his/her understanding and acceptance.   Dental advisory given  Plan Discussed with: CRNA  Anesthesia Plan Comments:         Anesthesia Quick Evaluation

## 2018-04-09 NOTE — Anesthesia Procedure Notes (Signed)
Procedure Name: Intubation Date/Time: 04/09/2018 8:46 AM Performed by: Montel Clock, CRNA Pre-anesthesia Checklist: Patient identified, Emergency Drugs available, Suction available, Patient being monitored and Timeout performed Patient Re-evaluated:Patient Re-evaluated prior to induction Oxygen Delivery Method: Circle system utilized Preoxygenation: Pre-oxygenation with 100% oxygen Induction Type: IV induction Ventilation: Mask ventilation without difficulty and Oral airway inserted - appropriate to patient size Laryngoscope Size: Mac and 3 Grade View: Grade I Tube type: Oral Tube size: 7.5 mm Number of attempts: 1 Airway Equipment and Method: Stylet Placement Confirmation: ETT inserted through vocal cords under direct vision,  positive ETCO2 and breath sounds checked- equal and bilateral Secured at: 23 cm Tube secured with: Tape Dental Injury: Teeth and Oropharynx as per pre-operative assessment

## 2018-04-10 ENCOUNTER — Encounter (HOSPITAL_COMMUNITY): Payer: Self-pay | Admitting: Urology

## 2018-04-10 DIAGNOSIS — C61 Malignant neoplasm of prostate: Secondary | ICD-10-CM | POA: Diagnosis not present

## 2018-04-10 LAB — BASIC METABOLIC PANEL
Anion gap: 6 (ref 5–15)
BUN: 14 mg/dL (ref 6–20)
CO2: 27 mmol/L (ref 22–32)
Calcium: 8.3 mg/dL — ABNORMAL LOW (ref 8.9–10.3)
Chloride: 105 mmol/L (ref 98–111)
Creatinine, Ser: 0.93 mg/dL (ref 0.61–1.24)
GFR calc Af Amer: >60 mL/min (ref >60)
GFR calc non Af Amer: >60 mL/min (ref >60)
Glucose, Bld: 183 mg/dL — ABNORMAL HIGH (ref 70–99)
Potassium: 4.6 mmol/L (ref 3.5–5.1)
SODIUM: 138 mmol/L (ref 135–145)

## 2018-04-10 LAB — GLUCOSE, CAPILLARY
GLUCOSE-CAPILLARY: 280 mg/dL — AB (ref 70–99)
Glucose-Capillary: 186 mg/dL — ABNORMAL HIGH (ref 70–99)
Glucose-Capillary: 129 mg/dL — ABNORMAL HIGH (ref 70–99)
Glucose-Capillary: 178 mg/dL — ABNORMAL HIGH (ref 70–99)

## 2018-04-10 LAB — CREATININE, FLUID (PLEURAL, PERITONEAL, JP DRAINAGE): Creat, Fluid: 0.9 mg/dL

## 2018-04-10 LAB — HEMOGLOBIN AND HEMATOCRIT, BLOOD
HCT: 36.9 % — ABNORMAL LOW (ref 39.0–52.0)
Hemoglobin: 12.2 g/dL — ABNORMAL LOW (ref 13.0–17.0)

## 2018-04-10 NOTE — Care Management Note (Signed)
Case Management Note  Patient Details  Name: Jesse Shah MRN: [id number] Date of Birth: 02/05/1961  Subjective/Objective: Prostate Ca. From home. No CM needs.                   Action/Plan:d/c home.   Expected Discharge Date:  04/10/18               Expected Discharge Plan:  Home/Self Care  In-House Referral:     Discharge planning Services  CM Consult  Post Acute Care Choice:    Choice offered to:     DME Arranged:    DME Agency:     HH Arranged:    HH Agency:     Status of Service:  Completed, signed off  If discussed at [employer] of Stay Meetings, dates discussed:    Additional Comments:  Dessa Phi, RN 04/10/2018, 12:41 PM

## 2018-04-10 NOTE — Progress Notes (Signed)
JP - Creatinine fluid sent at 10AM due to drain being emptied prior to shift start. 36ml of JP fluid accumulated and sent.

## 2018-04-10 NOTE — Discharge Summary (Signed)
[facility] Discharge Summary  Admit date: 04/09/2018  Discharge date and time: 04/10/18   Discharge to: Home  Discharge Service: Urology  Discharge Attending Physician:  Alexis Frock, MD  Discharge  Diagnoses: <principal problem not specified>  Secondary Diagnosis: Active Problems:   Prostate cancer ([place])   OR Procedures: Procedure(s): XI ROBOTIC ASSISTED LAPAROSCOPIC RADICAL PROSTATECTOMY LYMPHADENECTOMY 04/09/2018   Ancillary Procedures: None   Discharge Day Services: The patient was seen and examined by the Urology team both in the morning and immediately prior to discharge.  Vital signs and laboratory values were stable and within normal limits.  The physical exam was benign and unchanged and all surgical wounds were examined.  Discharge instructions were explained and all questions answered.  Subjective  No acute events overnight. Pain Controlled. No fever or chills.  Objective Patient Vitals for the past 8 hrs:  BP Temp Temp src Pulse Resp SpO2  04/10/18 1034 135/68 98.5 F (36.9 C) Oral 75 12 98 %  04/10/18 0454 126/68 98.8 F (37.1 C) Oral 71 14 98 %   Total I/O In: 100 [P.O.:100] Out: 7124 [Urine:1325; Drains:70]  General Appearance:        No acute distress Lungs:                       Normal work of breathing on room air Heart:                                Regular rate and rhythm Abdomen:                         Soft, appropriately tender, non-distended, incisions c/d/i Extremities:                      Warm and well perfused   Hospital Course:  The patient underwent RALP with b/l lymphadenectomy on 04/09/2018.  The patient tolerated the procedure well, was extubated in the OR, and afterwards was taken to the PACU for routine post-surgical care. When stable the patient was transferred to the floor. The patient did well postoperatively.  The patient's diet was slowly advanced and at the time of discharge was tolerating a regular diet. JP Cr was sent and  returned same as serum. This was subsequently removed.  The patient was discharged home 1 Day Post-Op, at which point was tolerating a regular solid diet, was able to void spontaneously, have adequate pain control with P.O. pain medication, and could ambulate without difficulty. The patient will follow up with [place] for post op check.   Condition at Discharge: Improved  Discharge Medications:  Allergies as of 04/10/2018   No Known Allergies     Medication List    TAKE these medications   HYDROcodone-acetaminophen 5-325 MG tablet Commonly known as:  NORCO Take 1-2 tablets by mouth every 6 (six) hours as needed for moderate pain or severe pain.   insulin lispro 100 UNIT/ML injection Commonly known as:  HUMALOG Inject 5-10 Units into the skin 3 (three) times daily before meals. Per sliding scale   simvastatin 20 MG tablet Commonly known as:  ZOCOR Take 20 mg by mouth every evening.   sulfamethoxazole-trimethoprim 800-160 MG tablet Commonly known as:  BACTRIM DS,SEPTRA DS Take 1 tablet by mouth 2 (two) times daily. Start the day prior to foley removal appointment   TOUJEO SOLOSTAR 300 UNIT/ML Sopn  Generic drug:  Insulin Glargine Inject 20 Units into the skin every evening.

## 2018-04-25 DIAGNOSIS — E1065 Type 1 diabetes mellitus with hyperglycemia: Secondary | ICD-10-CM | POA: Diagnosis not present

## 2018-04-25 DIAGNOSIS — E7800 Pure hypercholesterolemia, unspecified: Secondary | ICD-10-CM | POA: Diagnosis not present

## 2018-04-29 DIAGNOSIS — E7800 Pure hypercholesterolemia, unspecified: Secondary | ICD-10-CM | POA: Diagnosis not present

## 2018-04-29 DIAGNOSIS — E039 Hypothyroidism, unspecified: Secondary | ICD-10-CM | POA: Diagnosis not present

## 2018-04-29 DIAGNOSIS — E1065 Type 1 diabetes mellitus with hyperglycemia: Secondary | ICD-10-CM | POA: Diagnosis not present

## 2018-04-29 DIAGNOSIS — E559 Vitamin D deficiency, unspecified: Secondary | ICD-10-CM | POA: Diagnosis not present

## 2018-05-09 DIAGNOSIS — M6281 Muscle weakness (generalized): Secondary | ICD-10-CM | POA: Diagnosis not present

## 2018-05-09 DIAGNOSIS — M62838 Other muscle spasm: Secondary | ICD-10-CM | POA: Diagnosis not present

## 2018-05-09 DIAGNOSIS — N393 Stress incontinence (female) (male): Secondary | ICD-10-CM | POA: Diagnosis not present

## 2018-06-22 DIAGNOSIS — E109 Type 1 diabetes mellitus without complications: Secondary | ICD-10-CM | POA: Diagnosis not present

## 2018-06-22 DIAGNOSIS — Z794 Long term (current) use of insulin: Secondary | ICD-10-CM | POA: Diagnosis not present

## 2018-07-08 DIAGNOSIS — E1069 Type 1 diabetes mellitus with other specified complication: Secondary | ICD-10-CM | POA: Diagnosis not present

## 2018-07-08 DIAGNOSIS — Z719 Counseling, unspecified: Secondary | ICD-10-CM | POA: Diagnosis not present

## 2018-07-08 DIAGNOSIS — J309 Allergic rhinitis, unspecified: Secondary | ICD-10-CM | POA: Diagnosis not present

## 2018-07-10 DIAGNOSIS — R300 Dysuria: Secondary | ICD-10-CM | POA: Diagnosis not present

## 2018-07-10 DIAGNOSIS — C61 Malignant neoplasm of prostate: Secondary | ICD-10-CM | POA: Diagnosis not present

## 2018-07-10 DIAGNOSIS — B957 Other staphylococcus as the cause of diseases classified elsewhere: Secondary | ICD-10-CM | POA: Diagnosis not present

## 2018-07-10 DIAGNOSIS — R310 Gross hematuria: Secondary | ICD-10-CM | POA: Diagnosis not present

## 2018-07-10 DIAGNOSIS — N390 Urinary tract infection, site not specified: Secondary | ICD-10-CM | POA: Diagnosis not present

## 2018-07-24 DIAGNOSIS — J309 Allergic rhinitis, unspecified: Secondary | ICD-10-CM | POA: Diagnosis not present

## 2018-09-11 ENCOUNTER — Other Ambulatory Visit: Payer: Self-pay | Admitting: Family Medicine

## 2018-09-11 DIAGNOSIS — R1013 Epigastric pain: Secondary | ICD-10-CM

## 2018-11-20 ENCOUNTER — Encounter (HOSPITAL_COMMUNITY): Payer: Self-pay | Admitting: Emergency Medicine

## 2018-11-20 ENCOUNTER — Emergency Department (HOSPITAL_COMMUNITY): Payer: 59

## 2018-11-20 ENCOUNTER — Other Ambulatory Visit: Payer: Self-pay

## 2018-11-20 ENCOUNTER — Emergency Department (HOSPITAL_COMMUNITY)
Admission: EM | Admit: 2018-11-20 | Discharge: 2018-11-20 | Disposition: A | Discharge status: Home / Self Care | Payer: 59 | Attending: Emergency Medicine | Admitting: Emergency Medicine

## 2018-11-20 DIAGNOSIS — R0600 Dyspnea, unspecified: Secondary | ICD-10-CM | POA: Diagnosis not present

## 2018-11-20 DIAGNOSIS — E119 Type 2 diabetes mellitus without complications: Secondary | ICD-10-CM | POA: Insufficient documentation

## 2018-11-20 DIAGNOSIS — M25512 Pain in left shoulder: Secondary | ICD-10-CM | POA: Insufficient documentation

## 2018-11-20 DIAGNOSIS — Z794 Long term (current) use of insulin: Secondary | ICD-10-CM | POA: Insufficient documentation

## 2018-11-20 DIAGNOSIS — Z8546 Personal history of malignant neoplasm of prostate: Secondary | ICD-10-CM | POA: Diagnosis not present

## 2018-11-20 DIAGNOSIS — R0789 Other chest pain: Secondary | ICD-10-CM | POA: Diagnosis present

## 2018-11-20 LAB — CBC
HCT: 43.4 % (ref 39.0–52.0)
Hemoglobin: 14.8 g/dL (ref 13.0–17.0)
MCH: 30.7 pg (ref 26.0–34.0)
MCHC: 34.1 g/dL (ref 30.0–36.0)
MCV: 90 fL (ref 80.0–100.0)
Platelets: 242 10*3/uL (ref 150–400)
RBC: 4.82 MIL/uL (ref 4.22–5.81)
RDW: 12 % (ref 11.5–15.5)
WBC: 7.7 10*3/uL (ref 4.0–10.5)
nRBC: 0 % (ref 0.0–0.2)

## 2018-11-20 LAB — BASIC METABOLIC PANEL
Anion gap: 10 (ref 5–15)
BUN: 16 mg/dL (ref 6–20)
CO2: 26 mmol/L (ref 22–32)
Calcium: 9 mg/dL (ref 8.9–10.3)
Chloride: 102 mmol/L (ref 98–111)
Creatinine, Ser: 0.94 mg/dL (ref 0.61–1.24)
GFR calc Af Amer: >60 mL/min (ref >60)
GFR calc non Af Amer: >60 mL/min (ref >60)
Glucose, Bld: 284 mg/dL — ABNORMAL HIGH (ref 70–99)
Potassium: 4.1 mmol/L (ref 3.5–5.1)
Sodium: 138 mmol/L (ref 135–145)

## 2018-11-20 LAB — TROPONIN I (HIGH SENSITIVITY)
Troponin I (High Sensitivity): 4 ng/L (ref <18)
Troponin I (High Sensitivity): 4 ng/L (ref <18)

## 2018-11-20 LAB — D-DIMER, QUANTITATIVE (NOT AT ARMC): D-Dimer, Quant: <0.27 ug/mL-FEU (ref 0.00–0.50)

## 2018-11-20 MED ORDER — SODIUM CHLORIDE 0.9% FLUSH
3.0000 mL | Freq: Once | INTRAVENOUS | Status: AC
  Administered 2018-11-20: 3 mL via INTRAVENOUS

## 2018-11-20 NOTE — ED Provider Notes (Signed)
[facility] EMERGENCY DEPARTMENT Provider Note   CSN: [id number] Arrival date & time: 11/20/18  1114     History   Chief Complaint Chief Complaint  Patient presents with  . Chest Pain    HPI Jesse Shah is a 58 y.o. male.  HPI: A 58 year old patient with a history of treated diabetes, hypertension and hypercholesterolemia presents for evaluation of chest pain. Initial onset of pain was more than 6 hours ago. The patient's chest pain is described as heaviness/pressure/tightness and is not worse with exertion. The patient's chest pain is middle- or left-sided, is not well-localized, is not sharp and does not radiate to the arms/jaw/neck. The patient does not complain of nausea and denies diaphoresis. The patient has a family history of coronary artery disease in a first-degree relative with onset less than age 42. The patient has no history of stroke, has no history of peripheral artery disease, has not smoked in the past 90 days and does not have an elevated BMI (>=30).   HPI Patient presents with roughly 1 month of shortness of breath.  States his chest feels "congested.  Denies cough or wheezing.  Shortness of breath is not exertional.  Has been on several antihistamines with minimal changes to the symptoms.  No new lower extremity swelling or pain.  Patient has an appointment to see a cardiologist next month.  States that he developed some left bicep pain earlier today and this prompted his visit today.  States the pain of the bicep is worse with movement at the shoulder and flexion at the elbow.  No known injuries.  Patient states his dad did have bypass surgery and his mid 16s. Past Medical History:  Diagnosis Date  . Cancer [facility])    prostate cancer  . Diabetes ()   . High cholesterol     Patient Active Problem List   Diagnosis Date Noted  . Prostate cancer ([place]) 04/09/2018  . Paresthesia 02/15/2016    Past Surgical History:  Procedure Laterality  Date  . LYMPHADENECTOMY Bilateral 04/09/2018   Procedure: LYMPHADENECTOMY;  Surgeon: Alexis Frock, MD;  Location: [facility] ORS;  Service: Urology;  Laterality: Bilateral;  . PROSTATE BIOPSY    . ROBOT ASSISTED LAPAROSCOPIC RADICAL PROSTATECTOMY N/A 04/09/2018   Procedure: XI ROBOTIC ASSISTED LAPAROSCOPIC RADICAL PROSTATECTOMY;  Surgeon: Alexis Frock, MD;  Location: [facility] ORS;  Service: Urology;  Laterality: N/A;  3 HRS        Home Medications    Prior to Admission medications   Medication Sig Start Date End Date Taking? Authorizing Provider  HYDROcodone-acetaminophen (NORCO) 5-325 MG tablet Take 1-2 tablets by mouth every 6 (six) hours as needed for moderate pain or severe pain. 04/09/18   Debbrah Alar, PA-C  Insulin Glargine (TOUJEO SOLOSTAR) 300 UNIT/ML SOPN Inject 20 Units into the skin every evening.     [provider]  insulin lispro (HUMALOG) 100 UNIT/ML injection Inject 5-10 Units into the skin 3 (three) times daily before meals. Per sliding scale    [provider]  simvastatin (ZOCOR) 20 MG tablet Take 20 mg by mouth every evening.     [provider]  sulfamethoxazole-trimethoprim (BACTRIM DS,SEPTRA DS) 800-160 MG tablet Take 1 tablet by mouth 2 (two) times daily. Start the day prior to foley removal appointment 04/09/18   Debbrah Alar, PA-C    Family History Family History  Problem Relation Age of Onset  . Healthy Mother   . Healthy Father   . Healthy Sister   .  Healthy Brother     Social History Social History   Tobacco Use  . Smoking status: Never Smoker  . Smokeless tobacco: Never Used  Substance Use Topics  . Alcohol use: No    Comment: Quit 2004  . Drug use: No     Allergies   Patient has no known allergies.   Review of Systems Review of Systems  Constitutional: Positive for fever. Negative for chills.  HENT: Negative for sore throat and trouble swallowing.   Eyes: Negative for visual disturbance.  Respiratory: Positive for  shortness of breath. Negative for cough.   Cardiovascular: Negative for chest pain, palpitations and leg swelling.  Gastrointestinal: Negative for abdominal pain, constipation, diarrhea, nausea and vomiting.  Musculoskeletal: Positive for arthralgias and myalgias. Negative for back pain, neck pain and neck stiffness.  Skin: Negative for rash and wound.  Neurological: Negative for dizziness, weakness, light-headedness, numbness and headaches.  All other systems reviewed and are negative.    Physical Exam Updated Vital Signs BP 134/90 (BP Location: Right Arm)   Pulse (!) 59   Temp 98.7 F (37.1 C) (Oral)   Resp 18   Ht 6' (1.829 m)   Wt 81.6 kg   SpO2 99%   BMI 24.41 kg/m   Physical Exam Vitals signs and nursing note reviewed.  Constitutional:      General: He is not in acute distress.    Appearance: Normal appearance. He is well-developed. He is not ill-appearing.  HENT:     Head: Normocephalic and atraumatic.     Nose: Nose normal.  Eyes:     Extraocular Movements: Extraocular movements intact.     Conjunctiva/sclera: Conjunctivae normal.     Pupils: Pupils are equal, round, and reactive to light.  Neck:     Musculoskeletal: Normal range of motion and neck supple. No neck rigidity or muscular tenderness.     Comments: No posterior midline cervical tenderness to palpation. Cardiovascular:     Rate and Rhythm: Normal rate and regular rhythm.     Heart sounds: No murmur. No friction rub. No gallop.   Pulmonary:     Effort: Pulmonary effort is normal. No respiratory distress.     Breath sounds: Normal breath sounds. No stridor. No wheezing, rhonchi or rales.  Chest:     Chest wall: No tenderness.  Abdominal:     General: Bowel sounds are normal.     Palpations: Abdomen is soft.     Tenderness: There is no abdominal tenderness. There is no guarding or rebound.  Musculoskeletal: Normal range of motion.        General: Tenderness present. No swelling, deformity or signs of  injury.     Right lower leg: No edema.     Left lower leg: No edema.     Comments: Mild tenderness at the left bicep especially with range of motion of the left shoulder.  Distal pulses are 2+.  Compartments are soft.  No midline thoracic or lumbar tenderness.  No lower extremity swelling, asymmetry or tenderness.    Lymphadenopathy:     Cervical: No cervical adenopathy.  Skin:    General: Skin is warm and dry.     Capillary Refill: Capillary refill takes less than 2 seconds.     Findings: No erythema or rash.  Neurological:     General: No focal deficit present.     Mental Status: He is alert and oriented to person, place, and time.     Comments: 5/5 motor in  extremities.  Sensation intact.  Psychiatric:        Behavior: Behavior normal.      ED Treatments / Results  Labs (all labs ordered are listed, but only abnormal results are displayed) Labs Reviewed  BASIC METABOLIC PANEL - Abnormal; Notable for the following components:      Result Value   Glucose, Bld 284 (*)    All other components within normal limits  CBC  D-DIMER, QUANTITATIVE (NOT AT [facility])  TROPONIN I (HIGH SENSITIVITY)  TROPONIN I (HIGH SENSITIVITY)    EKG EKG Interpretation  Date/Time:  Thursday November 20 2018 11:19:44 EDT Ventricular Rate:  85 PR Interval:  146 QRS Duration: 76 QT Interval:  364 QTC Calculation: 433 R Axis:   35 Text Interpretation:  Normal sinus rhythm Normal ECG Confirmed by Julianne Rice [phone]) on 11/20/2018 12:24:56 PM   Radiology Dg Chest 2 View  Result Date: 11/20/2018 CLINICAL DATA:  Chest pain, shortness of breath EXAM: CHEST - 2 VIEW COMPARISON:  None. FINDINGS: The heart size and mediastinal contours are within normal limits. Both lungs are clear. The visualized skeletal structures are unremarkable. IMPRESSION: No active cardiopulmonary disease. Electronically Signed   By: Davina Poke M.D.   On: 11/20/2018 12:15    Procedures Procedures (including critical care  time)  Medications Ordered in ED Medications  sodium chloride flush (NS) 0.9 % injection 3 mL (3 mLs Intravenous Given 11/20/18 1332)     Initial Impression / Assessment and Plan / ED Course  I have reviewed the triage vital signs and the nursing notes.  Pertinent labs & imaging results that were available during my care of the patient were reviewed by me and considered in my medical decision making (see chart for details).     HEAR Score: 4 Troponin x2 is normal.  Normal EKG.  Normal d-dimer.  Patient does have a heart score of 4 the presentation is very atypical for CAD.  Discussed with Dr. Einar Gip.  Will discharge home and has appointment to follow-up with cardiology in 4 days.  Strict return precautions have been given.   Final Clinical Impressions(s) / ED Diagnoses   Final diagnoses:  Dyspnea, unspecified type    ED Discharge Orders    None       Julianne Rice, MD 11/20/18 1550

## 2018-11-20 NOTE — ED Triage Notes (Signed)
Pt states he has had an on going problem with SOB for several months. Pt has not been able to see cards or primary MD. Pt states over the past few days he has had some tingling in his left arm. States he feels some chest tightness. No cough

## 2018-11-24 ENCOUNTER — Other Ambulatory Visit: Payer: Self-pay

## 2018-11-24 ENCOUNTER — Encounter: Payer: Self-pay | Admitting: Cardiology

## 2018-11-24 ENCOUNTER — Ambulatory Visit (INDEPENDENT_AMBULATORY_CARE_PROVIDER_SITE_OTHER): Payer: 59 | Admitting: Cardiology

## 2018-11-24 VITALS — BP 128/84 | HR 67 | Ht 72.0 in | Wt 187.0 lb

## 2018-11-24 DIAGNOSIS — R0609 Other forms of dyspnea: Secondary | ICD-10-CM

## 2018-11-24 DIAGNOSIS — R079 Chest pain, unspecified: Secondary | ICD-10-CM | POA: Diagnosis not present

## 2018-11-24 DIAGNOSIS — R0600 Dyspnea, unspecified: Secondary | ICD-10-CM | POA: Insufficient documentation

## 2018-11-24 NOTE — Progress Notes (Signed)
Patient referred by Jesse Bien, MD for chest pain, shortness of breath  Subjective:   Jesse Shah, male    DOB: May 11, 1960, 58 y.o.   MRN: [id number]   Chief Complaint  Patient presents with  . Chest Pain  . Shortness of Breath  . New Patient (Initial Visit)     HPI  58 year old Caucasian male with hypertension, controlled diabetes, hyperlipidemia, referred for evaluation of chest pain.  Patient was recently seen in [facility] emergency department on 11/20/2022 complaint of left-sided chest/shoulder pain.  His work-up including EKG, high-sensitivity troponin x2, d-dimer, chest x-ray was unremarkable.  Patient works as a Forensic scientist at [employer]. He has been experiencing shortness of breath on exertion, that gets better with rest. He does not have these symptoms at rest. Suspecting seasonal allergies, he was started on on montelukast by her PCP without any benefit.   Past Medical History:  Diagnosis Date  . Cancer [facility])    prostate cancer  . Diabetes ([place])   . High cholesterol      Past Surgical History:  Procedure Laterality Date  . LYMPHADENECTOMY Bilateral 04/09/2018   Procedure: LYMPHADENECTOMY;  Surgeon: Alexis Frock, MD;  Location: [facility];  Service: Urology;  Laterality: Bilateral;  . PROSTATE BIOPSY    . ROBOT ASSISTED LAPAROSCOPIC RADICAL PROSTATECTOMY N/A 04/09/2018   Procedure: XI ROBOTIC ASSISTED LAPAROSCOPIC RADICAL PROSTATECTOMY;  Surgeon: Alexis Frock, MD;  Location: [facility];  Service: Urology;  Laterality: N/A;  3 HRS     Social History   Socioeconomic History  . Marital status: Married    Spouse name: Not on file  . Number of children: 3  . Years of education: College  . Highest education level: Not on file  Occupational History  . Occupation: N/A  Social Needs  . Financial resource strain: Not on file  . Food insecurity    Worry: Not on file    Inability: Not on file  . Transportation needs    Medical: Not on file    Non-medical:  Not on file  Tobacco Use  . Smoking status: Never Smoker  . Smokeless tobacco: Never Used  Substance and Sexual Activity  . Alcohol use: No    Comment: Quit 2004  . Drug use: No  . Sexual activity: Not on file    Comment: Married  Lifestyle  . Physical activity    Days per week: Not on file    Minutes per session: Not on file  . Stress: Not on file  Relationships  . Social Herbalist on phone: Not on file    Gets together: Not on file    Attends religious service: Not on file    Active member of club or organization: Not on file    Attends meetings of clubs or organizations: Not on file    Relationship status: Not on file  . Intimate partner violence    Fear of current or ex partner: Not on file    Emotionally abused: Not on file    Physically abused: Not on file    Forced sexual activity: Not on file  Other Topics Concern  . Not on file  Social History Narrative   Lives at home w/ his wife   Writes left-handed but does everything else right-handed   Caffeine: 3-4 cups daily     Family History  Problem Relation Age of Onset  . Healthy Mother   . Heart disease Father  CABG at 86  . Healthy Sister   . Healthy Brother      Current Outpatient Medications on File Prior to Visit  Medication Sig Dispense Refill  . HYDROcodone-acetaminophen (NORCO) 5-325 MG tablet Take 1-2 tablets by mouth every 6 (six) hours as needed for moderate pain or severe pain. 30 tablet 0  . Insulin Glargine (TOUJEO SOLOSTAR) 300 UNIT/ML SOPN Inject 20 Units into the skin every evening.     . insulin lispro (HUMALOG) 100 UNIT/ML injection Inject 5-10 Units into the skin 3 (three) times daily before meals. Per sliding scale    . simvastatin (ZOCOR) 20 MG tablet Take 20 mg by mouth every evening.     . sulfamethoxazole-trimethoprim (BACTRIM DS,SEPTRA DS) 800-160 MG tablet Take 1 tablet by mouth 2 (two) times daily. Start the day prior to foley removal appointment 6 tablet 0   No  current facility-administered medications on file prior to visit.     Cardiovascular studies:  EKG 11/24/2018: Sinus rhythm 58 bpm. Early R wave transition, unchanged compared to previous EKG.    Recent labs: Results for DEANTA, COLANDREA (MRN [id number]) as of 11/24/2018 09:09  Ref. Range 11/20/2018 11:31  Sodium Latest Ref Range: 135 - 145 mmol/L 138  Potassium Latest Ref Range: 3.5 - 5.1 mmol/L 4.1  Chloride Latest Ref Range: 98 - 111 mmol/L 102  CO2 Latest Ref Range: 22 - 32 mmol/L 26  Glucose Latest Ref Range: 70 - 99 mg/dL 284 (H)  BUN Latest Ref Range: 6 - 20 mg/dL 16  Creatinine Latest Ref Range: 0.61 - 1.24 mg/dL 0.94  Calcium Latest Ref Range: 8.9 - 10.3 mg/dL 9.0  Anion gap Latest Ref Range: 5 - 15  10  GFR, Est Non African American Latest Ref Range: >60 mL/min >60  GFR, Est African American Latest Ref Range: >60 mL/min >60  Troponin I (High Sensitivity) Latest Ref Range: <18 ng/L 4   Results for FENWICK, PREVATT (MRN [id number]) as of 11/24/2018 09:09  Ref. Range 11/20/2018 11:31  WBC Latest Ref Range: 4.0 - 10.5 K/uL 7.7  RBC Latest Ref Range: 4.22 - 5.81 MIL/uL 4.82  Hemoglobin Latest Ref Range: 13.0 - 17.0 g/dL 14.8  HCT Latest Ref Range: 39.0 - 52.0 % 43.4  MCV Latest Ref Range: 80.0 - 100.0 fL 90.0  MCH Latest Ref Range: 26.0 - 34.0 pg 30.7  MCHC Latest Ref Range: 30.0 - 36.0 g/dL 34.1  RDW Latest Ref Range: 11.5 - 15.5 % 12.0  Platelets Latest Ref Range: 150 - 400 K/uL 242  nRBC Latest Ref Range: 0.0 - 0.2 % 0.0   Results for RAJ, TUMMINELLO (MRN [id number]) as of 11/24/2018 09:09  Ref. Range 04/08/2018 09:14 04/10/2018 06:06 11/20/2018 11:31  Glucose Latest Ref Range: 70 - 99 mg/dL 69 (L) 183 (H) 284 (H)  Hemoglobin A1C Latest Ref Range: 4.8 - 5.6 % 7.8 (H)      Results for EHSAN, GELLERT (MRN [id number]) as of 11/24/2018 09:09  Ref. Range 11/20/2018 13:32  D-Dimer, Quant Latest Ref Range: 0.00 - 0.50 ug/mL-FEU <0.27   11/18/2018: Chol 149, TG 56, HDL 58, LDL  80.  TSH normal.   Review of Systems  Constitution: Negative for decreased appetite, malaise/fatigue, weight gain and weight loss.  HENT: Negative for congestion.   Eyes: Negative for visual disturbance.  Cardiovascular: Positive for chest pain and dyspnea on exertion. Negative for leg swelling, palpitations and syncope.  Respiratory: Positive for shortness of breath. Negative for cough.   Endocrine: Negative for  cold intolerance.  Hematologic/Lymphatic: Does not bruise/bleed easily.  Skin: Negative for itching and rash.  Musculoskeletal: Negative for myalgias.  Gastrointestinal: Negative for abdominal pain, nausea and vomiting.  Genitourinary: Negative for dysuria.  Neurological: Negative for dizziness and weakness.  Psychiatric/Behavioral: The patient is not nervous/anxious.   All other systems reviewed and are negative.        Vitals:   11/24/18 1526  BP: 128/84  Pulse: 67  SpO2: 98%     There is no height or weight on file to calculate BMI. Filed Weights   11/24/18 1526  Weight: 187 lb (84.8 kg)     Objective:   Physical Exam  Constitutional: He is oriented to person, place, and time. He appears well-developed and well-nourished. No distress.  HENT:  Head: Normocephalic and atraumatic.  Eyes: Pupils are equal, round, and reactive to light. Conjunctivae are normal.  Neck: No JVD present.  Cardiovascular: Normal rate, regular rhythm and intact distal pulses.  Pulmonary/Chest: Effort normal and breath sounds normal. He has no wheezes. He has no rales.  Abdominal: Soft. Bowel sounds are normal. There is no rebound.  Musculoskeletal:        General: No edema.  Lymphadenopathy:    He has no cervical adenopathy.  Neurological: He is alert and oriented to person, place, and time. No cranial nerve deficit.  Skin: Skin is warm and dry.  Psychiatric: He has a normal mood and affect.  Nursing note and vitals reviewed.         Assessment & Recommendations:    58 year old Caucasian male with hypertension, controlled diabetes, hyperlipidemia, family h/o CAD with chest pain, shortness of breath  Chest pain, shortness of breath: While chest pain is atypical, exertional dyspnea is concerning for angina equivalent. Recommend echocardiogram and exercise nuclear stress test., Okay to take ASA 81 mg until further workup is completed.   Hyperlipidemia: Okay to continue simvastatin for now. If abnormal stress test, would witch to high intensity statin.    Thank you for referring the patient to [place]. Please feel free to contact with any questions.  Nigel Mormon, MD [facility] Pager: [phone] Office: [phone] If no answer Cell [phone]

## 2018-12-03 ENCOUNTER — Ambulatory Visit: Payer: 59 | Admitting: Cardiology

## 2018-12-10 ENCOUNTER — Ambulatory Visit (INDEPENDENT_AMBULATORY_CARE_PROVIDER_SITE_OTHER): Payer: 59

## 2018-12-10 ENCOUNTER — Other Ambulatory Visit: Payer: Self-pay

## 2018-12-10 DIAGNOSIS — R0600 Dyspnea, unspecified: Secondary | ICD-10-CM

## 2018-12-10 DIAGNOSIS — R0609 Other forms of dyspnea: Secondary | ICD-10-CM | POA: Diagnosis not present

## 2018-12-11 NOTE — Progress Notes (Signed)
LMOM with details

## 2018-12-12 ENCOUNTER — Ambulatory Visit (INDEPENDENT_AMBULATORY_CARE_PROVIDER_SITE_OTHER): Payer: 59

## 2018-12-12 ENCOUNTER — Other Ambulatory Visit: Payer: Self-pay

## 2018-12-12 DIAGNOSIS — R0609 Other forms of dyspnea: Secondary | ICD-10-CM

## 2018-12-12 DIAGNOSIS — R0600 Dyspnea, unspecified: Secondary | ICD-10-CM

## 2018-12-14 NOTE — Progress Notes (Signed)
Patient referred by [name], MD for chest pain, shortness of breath  Subjective:   Jesse Shah, male    DOB: 1960/11/29, 58 y.o.   MRN: [id number]  I connected with the patient on 12/25/2018 by a video enabled telemedicine application and verified that I am speaking with the correct person using two identifiers.     I discussed the limitations of evaluation and management by telemedicine and the availability of in person appointments. The patient expressed understanding and agreed to proceed.   This visit type was conducted due to national recommendations for restrictions regarding the COVID-19 Pandemic (e.g. social distancing).  This format is felt to be most appropriate for this patient at this time.  All issues noted in this document were discussed and addressed.  No physical exam was performed (except for noted visual exam findings with Tele health visits).  The patient has consented to conduct a Tele health visit and understands insurance will be billed.    Chief Complaint  Patient presents with  . Chest Pain     HPI  58 year old Caucasian male with hypertension, controlled diabetes, hyperlipidemia, referred for evaluation of chest pain.  Echocardiogram and exercise nuclear stress test were reassuring, details below. Patient has not had any further episodes.   Past Medical History:  Diagnosis Date  . Cancer [facility])    prostate cancer  . Diabetes ([place])   . High cholesterol      Past Surgical History:  Procedure Laterality Date  . LYMPHADENECTOMY Bilateral 04/09/2018   Procedure: LYMPHADENECTOMY;  Surgeon: Alexis Frock, MD;  Location: [facility];  Service: Urology;  Laterality: Bilateral;  . PROSTATE BIOPSY    . ROBOT ASSISTED LAPAROSCOPIC RADICAL PROSTATECTOMY N/A 04/09/2018   Procedure: XI ROBOTIC ASSISTED LAPAROSCOPIC RADICAL PROSTATECTOMY;  Surgeon: Alexis Frock, MD;  Location: [facility];  Service: Urology;  Laterality: N/A;  3 HRS     Social History   Socioeconomic History  . Marital status: Married    Spouse name: Not on file  . Number of children: 3  . Years of education: College  . Highest education level: Not on file  Occupational History  . Occupation: N/A  Social Needs  . Financial resource strain: Not on file  . Food insecurity    Worry: Not on file    Inability: Not on file  . Transportation needs    Medical: Not on file    Non-medical: Not on file  Tobacco Use  . Smoking status: Never Smoker  . Smokeless tobacco: Never Used  Substance and Sexual Activity  . Alcohol use: No    Comment: Quit 2004  . Drug use: No  . Sexual activity: Not on file    Comment: Married  Lifestyle  . Physical activity    Days per week: Not on file    Minutes per session: Not on file  . Stress: Not on file  Relationships  . Social Herbalist on phone: Not on file    Gets together: Not on file    Attends religious service: Not on file    Active member of club or organization: Not on file    Attends meetings of clubs or organizations: Not on file    Relationship status: Not on file  . Intimate partner violence    Fear of current or ex partner: Not on file    Emotionally abused: Not on file    Physically abused: Not on file    Forced sexual  activity: Not on file  Other Topics Concern  . Not on file  Social History Narrative   Lives at home w/ his wife   Writes left-handed but does everything else right-handed   Caffeine: 3-4 cups daily     Family History  Problem Relation Age of Onset  . Healthy Mother   . Heart disease Father        CABG at 52  . Healthy Sister   . Healthy Brother      Current Outpatient Medications on File Prior to Visit  Medication Sig Dispense Refill  . budesonide-formoterol (SYMBICORT) 160-4.5 MCG/ACT inhaler Inhale 2 puffs into the lungs 2 (two) times daily.    . insulin aspart protamine- aspart (NOVOLOG MIX 70/30) (70-30) 100 UNIT/ML injection Inject into the skin. 30-60bunits daily     . montelukast (SINGULAIR) 10 MG tablet Take 10 mg by mouth at bedtime.    . simvastatin (ZOCOR) 20 MG tablet Take 20 mg by mouth every evening.      No current facility-administered medications on file prior to visit.     Cardiovascular studies:  Echocardiogram 12/12/2018: Left ventricle cavity is normal in size. Normal left ventricular wall thickness. Normal LV systolic function with EF 58%. Normal global wall motion. Normal diastolic filling pattern.  Mild (Grade I) mitral regurgitation. Inadequate TR jet to estimate pulmonary artery systolic pressure. Normal right atrial pressure.   Exercise Sestamibi stress test 12/10/2018: Exercise nuclear stress test was performed using Bruce protocol. Exercise capacity is normal (8.2 METS). Symptoms include dyspnea. Heart rate and hemodynamic response are normal. Stress EKG revealed sinus tachycardia, with no ischemic changes. Myocardial perfusion imaging is normal. LVEF 63%. Low risk study.  EKG 11/24/2018: Sinus rhythm 58 bpm. Early R wave transition, unchanged compared to previous EKG.    Recent labs: Results for CHANCELER, KOLB (MRN [id number]) as of 11/24/2018 09:09  Ref. Range 11/20/2018 11:31  Sodium Latest Ref Range: 135 - 145 mmol/L 138  Potassium Latest Ref Range: 3.5 - 5.1 mmol/L 4.1  Chloride Latest Ref Range: 98 - 111 mmol/L 102  CO2 Latest Ref Range: 22 - 32 mmol/L 26  Glucose Latest Ref Range: 70 - 99 mg/dL 284 (H)  BUN Latest Ref Range: 6 - 20 mg/dL 16  Creatinine Latest Ref Range: 0.61 - 1.24 mg/dL 0.94  Calcium Latest Ref Range: 8.9 - 10.3 mg/dL 9.0  Anion gap Latest Ref Range: 5 - 15  10  GFR, Est Non African American Latest Ref Range: >60 mL/min >60  GFR, Est African American Latest Ref Range: >60 mL/min >60  Troponin I (High Sensitivity) Latest Ref Range: <18 ng/L 4   Results for CANUTO, PATYK (MRN [id number]) as of 11/24/2018 09:09  Ref. Range 11/20/2018 11:31  WBC Latest Ref Range: 4.0 - 10.5 K/uL 7.7  RBC  Latest Ref Range: 4.22 - 5.81 MIL/uL 4.82  Hemoglobin Latest Ref Range: 13.0 - 17.0 g/dL 14.8  HCT Latest Ref Range: 39.0 - 52.0 % 43.4  MCV Latest Ref Range: 80.0 - 100.0 fL 90.0  MCH Latest Ref Range: 26.0 - 34.0 pg 30.7  MCHC Latest Ref Range: 30.0 - 36.0 g/dL 34.1  RDW Latest Ref Range: 11.5 - 15.5 % 12.0  Platelets Latest Ref Range: 150 - 400 K/uL 242  nRBC Latest Ref Range: 0.0 - 0.2 % 0.0   Results for AVERILL, GILILLAND (MRN [id number]) as of 11/24/2018 09:09  Ref. Range 04/08/2018 09:14 04/10/2018 06:06 11/20/2018 11:31  Glucose Latest Ref Range: 70 - 99 mg/dL  69 (L) 183 (H) 284 (H)  Hemoglobin A1C Latest Ref Range: 4.8 - 5.6 % 7.8 (H)      Results for ALIOU, YOON (MRN [id number]) as of 11/24/2018 09:09  Ref. Range 11/20/2018 13:32  D-Dimer, Quant Latest Ref Range: 0.00 - 0.50 ug/mL-FEU <0.27   11/18/2018: Chol 149, TG 56, HDL 58, LDL 80.  TSH normal.   Review of Systems  Constitution: Negative for decreased appetite, malaise/fatigue, weight gain and weight loss.  HENT: Negative for congestion.   Eyes: Negative for visual disturbance.  Cardiovascular: Positive for chest pain and dyspnea on exertion. Negative for leg swelling, palpitations and syncope.  Respiratory: Positive for shortness of breath. Negative for cough.   Endocrine: Negative for cold intolerance.  Hematologic/Lymphatic: Does not bruise/bleed easily.  Skin: Negative for itching and rash.  Musculoskeletal: Negative for myalgias.  Gastrointestinal: Negative for abdominal pain, nausea and vomiting.  Genitourinary: Negative for dysuria.  Neurological: Negative for dizziness and weakness.  Psychiatric/Behavioral: The patient is not nervous/anxious.   All other systems reviewed and are negative.       No vitals available.  Objective:   Physical Exam  Constitutional: He is oriented to person, place, and time. He appears well-developed and well-nourished. No distress.  Pulmonary/Chest: Effort normal.   Neurological: He is alert and oriented to person, place, and time.  Psychiatric: He has a normal mood and affect.  Nursing note and vitals reviewed.         Assessment & Recommendations:   58 year old Caucasian male with hypertension, controlled diabetes, hyperlipidemia, family h/o CAD with chest pain, shortness of breath  Chest pain, shortness of breath: Symptoms resolved. Stress test negative for ischemia. Echocardiogram normal.  Continue risk factor modification for risk factors.   Follow up as needed.   Nigel Mormon, MD [facility] Pager: [phone] Office: [phone] If no answer Cell [phone]

## 2018-12-19 ENCOUNTER — Other Ambulatory Visit: Payer: Self-pay

## 2018-12-19 DIAGNOSIS — Z20822 Contact with and (suspected) exposure to covid-19: Secondary | ICD-10-CM

## 2018-12-20 LAB — NOVEL CORONAVIRUS, NAA: SARS-CoV-2, NAA: NOT DETECTED

## 2018-12-25 ENCOUNTER — Other Ambulatory Visit: Payer: Self-pay

## 2018-12-25 ENCOUNTER — Telehealth (INDEPENDENT_AMBULATORY_CARE_PROVIDER_SITE_OTHER): Payer: 59 | Admitting: Cardiology

## 2018-12-25 DIAGNOSIS — R079 Chest pain, unspecified: Secondary | ICD-10-CM

## 2018-12-25 DIAGNOSIS — R0602 Shortness of breath: Secondary | ICD-10-CM | POA: Diagnosis not present

## 2019-04-02 ENCOUNTER — Ambulatory Visit: Payer: 59 | Admitting: Family Medicine

## 2019-09-12 IMAGING — DX CHEST - 2 VIEW
2 series · 2 of 2 positions shown · non-contrast
Comparison: None.

CLINICAL DATA: Chest pain, shortness of breath

EXAM:
CHEST - 2 VIEW

[w chest pa]
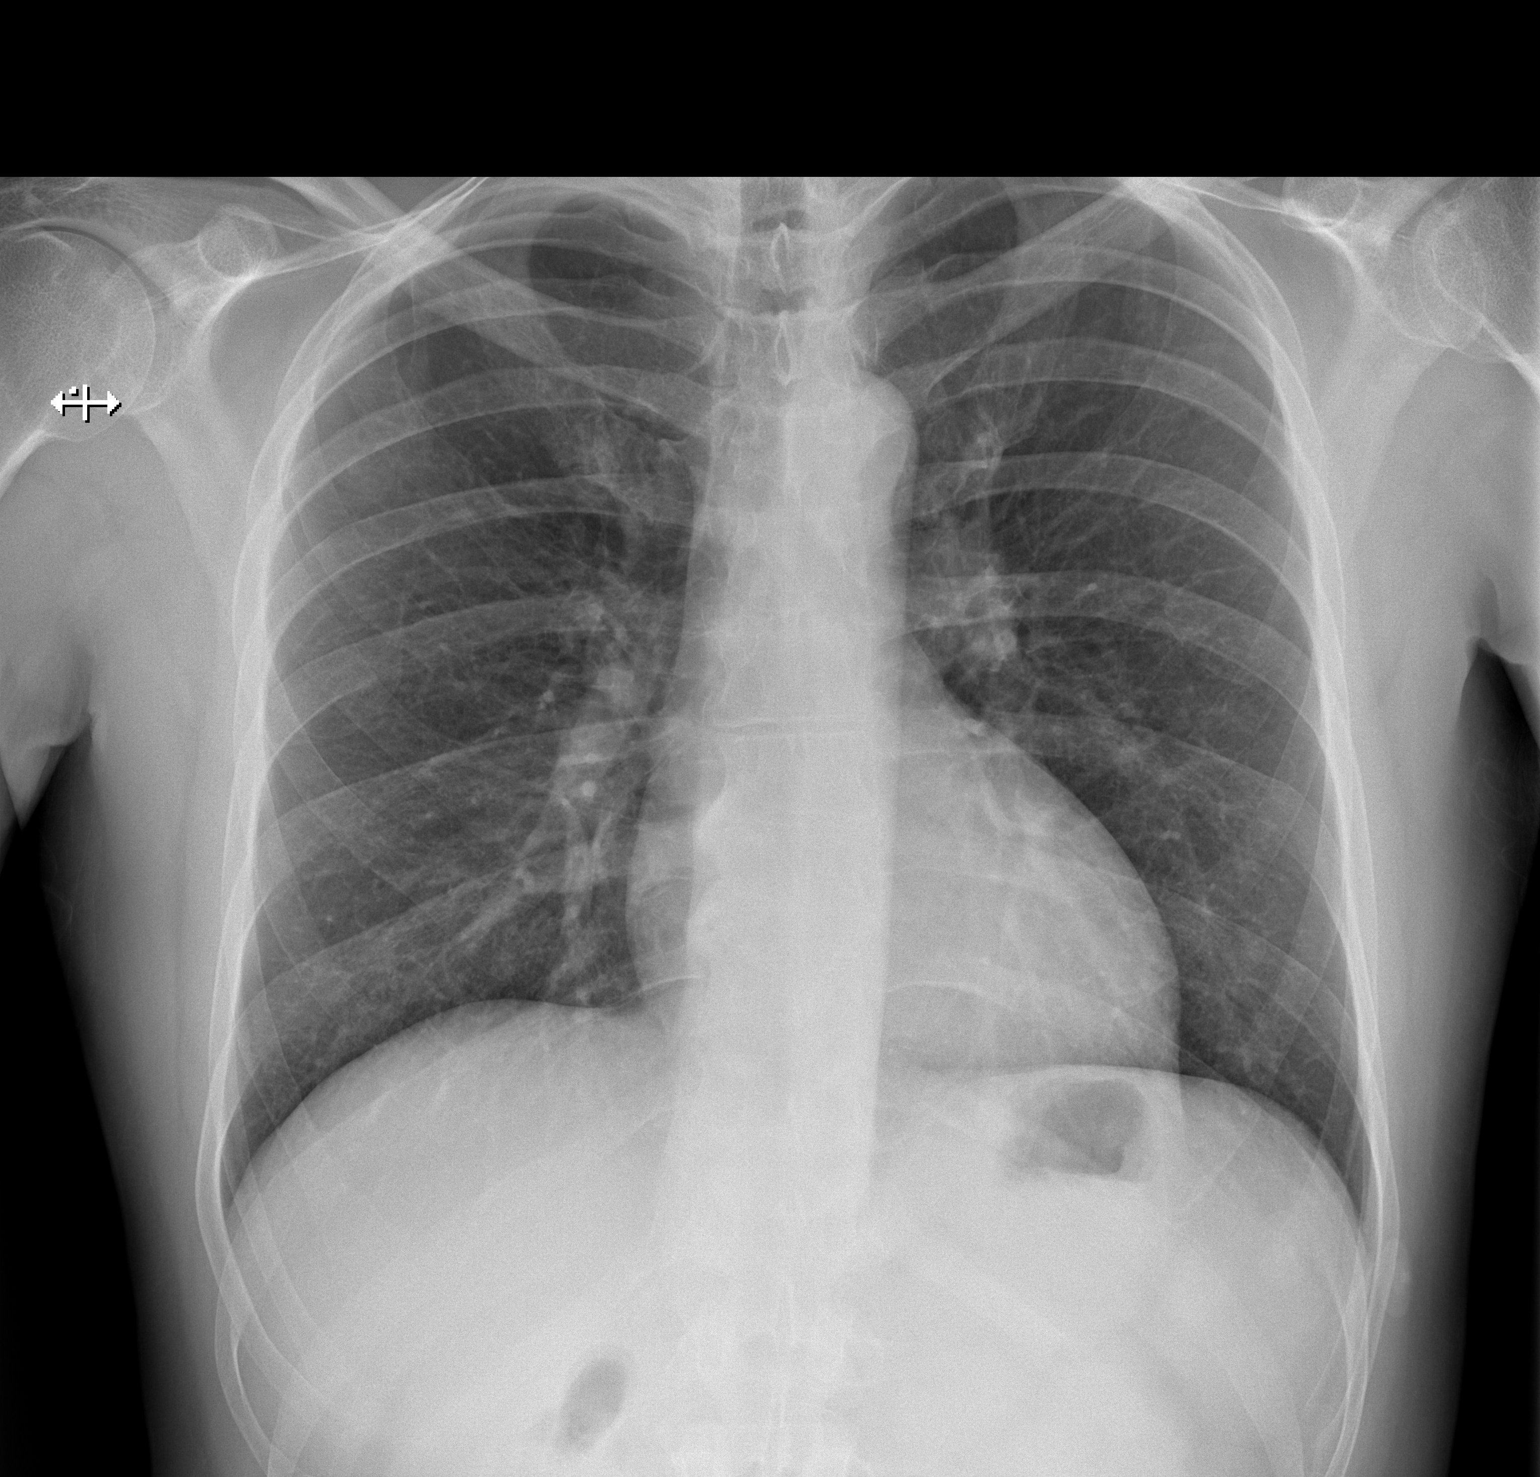

[w chest lat]
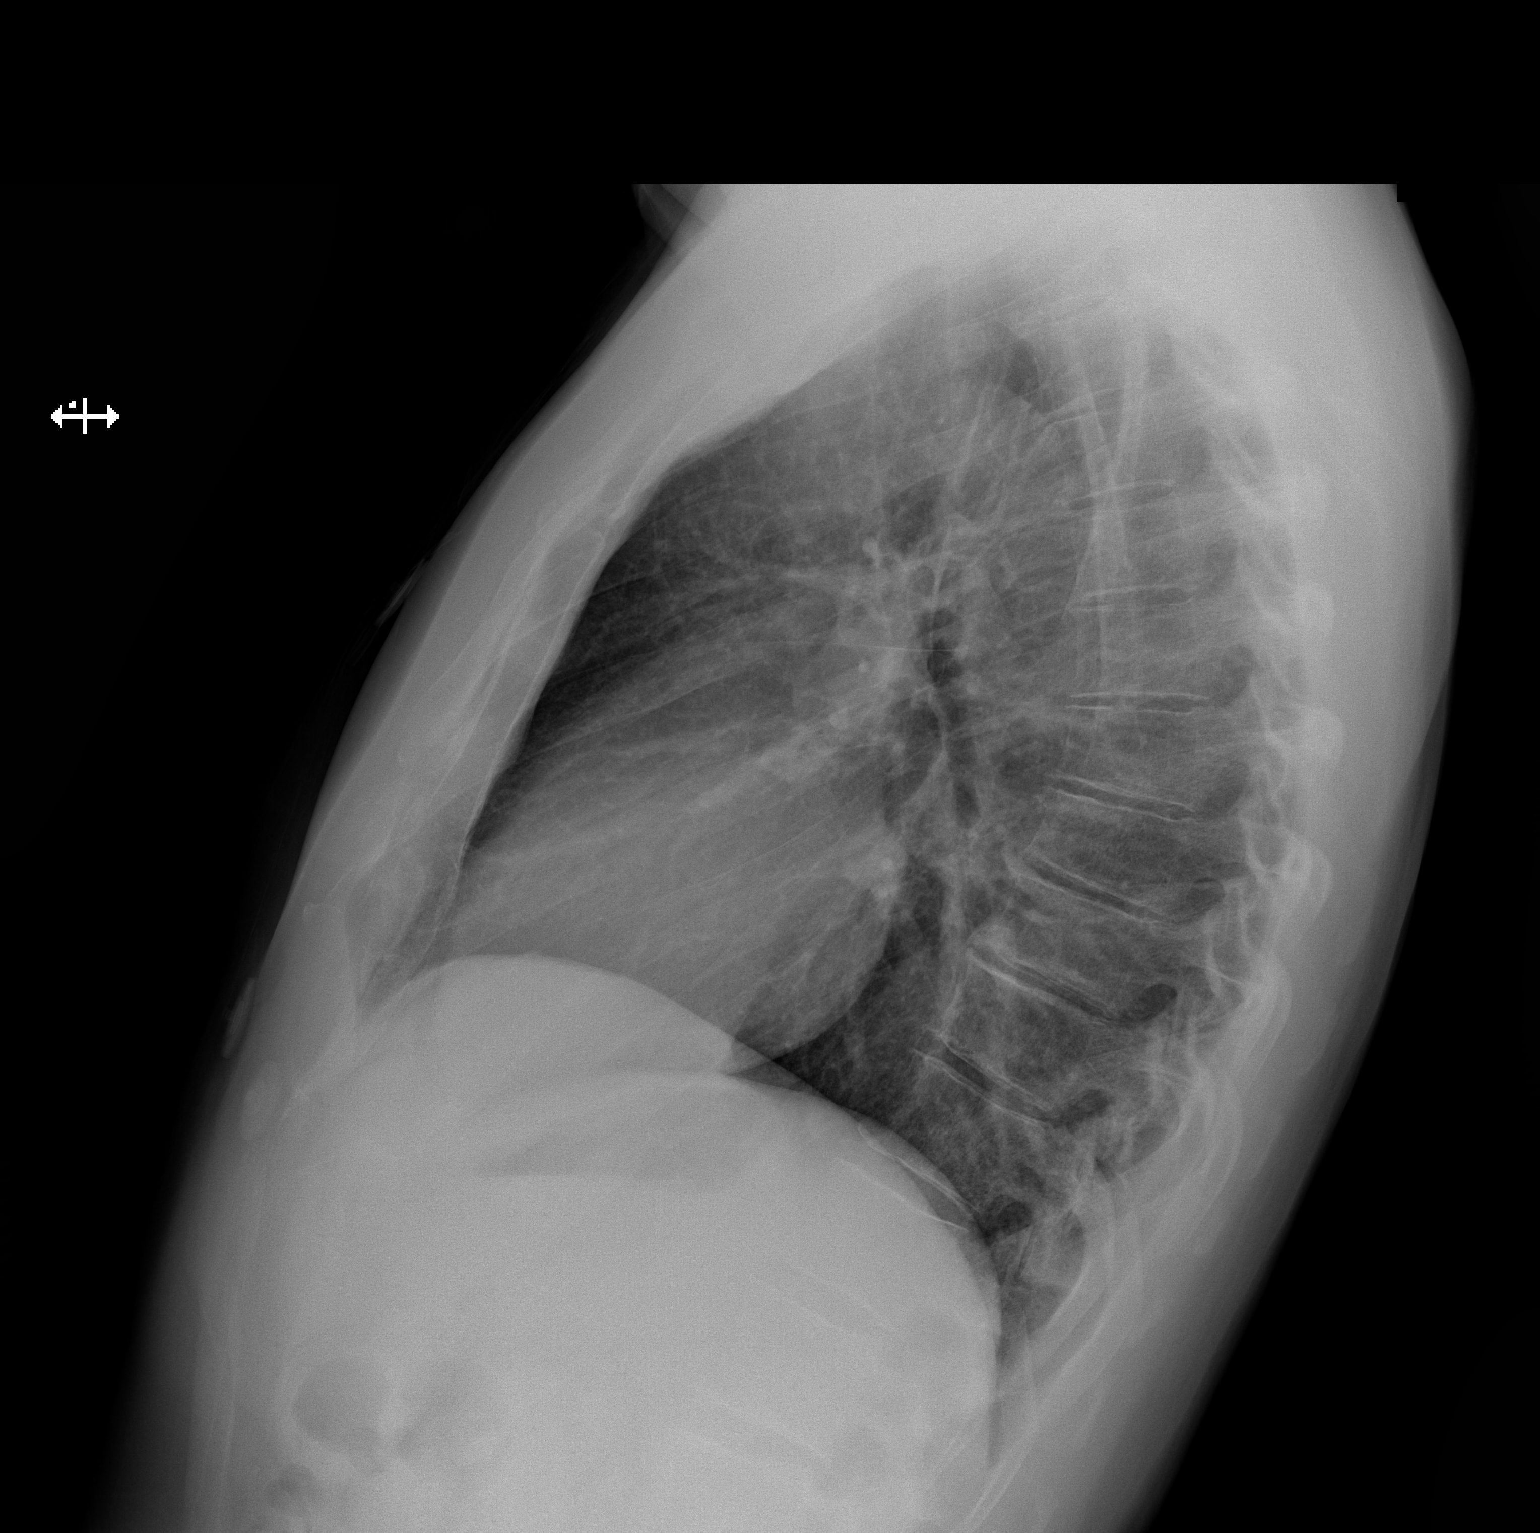

[2 of 2 positions shown; findings below may reference images not displayed]

FINDINGS: The heart size and mediastinal contours are within normal limits.
Both lungs are clear. The visualized skeletal structures are
unremarkable.
IMPRESSION: No active cardiopulmonary disease.
